# Patient Record
Sex: Male | Born: 1960 | Race: White | Hispanic: No | Marital: Married | Smoking: Never smoker
Health system: Southern US, Community
[De-identification: ages and names within clinical notes are randomized; demographics above are authoritative.]

## PROBLEM LIST (undated history)

## (undated) ENCOUNTER — Emergency Department (HOSPITAL_COMMUNITY): Discharge status: Absent without leave | Payer: BC Managed Care – PPO

## (undated) DIAGNOSIS — G500 Trigeminal neuralgia: Secondary | ICD-10-CM

## (undated) DIAGNOSIS — C19 Malignant neoplasm of rectosigmoid junction: Secondary | ICD-10-CM

## (undated) DIAGNOSIS — N189 Chronic kidney disease, unspecified: Secondary | ICD-10-CM

## (undated) DIAGNOSIS — Z86010 Personal history of colonic polyps: Secondary | ICD-10-CM

## (undated) HISTORY — DX: Personal history of colonic polyps: Z86.010

## (undated) HISTORY — DX: Chronic kidney disease, unspecified: N18.9

## (undated) HISTORY — PX: TRIGEMINAL NERVE DECOMPRESSION: SHX2579

## (undated) HISTORY — DX: Trigeminal neuralgia: G50.0

## (undated) HISTORY — DX: Malignant neoplasm of rectosigmoid junction: C19

## (undated) HISTORY — PX: WISDOM TOOTH EXTRACTION: SHX21

---

## 2004-06-13 ENCOUNTER — Encounter: Admission: RE | Admit: 2004-06-13 | Discharge: 2004-06-13 | Payer: Self-pay | Admitting: Internal Medicine

## 2004-06-13 ENCOUNTER — Encounter: Admission: RE | Admit: 2004-06-13 | Discharge: 2004-06-13 | Payer: Self-pay | Admitting: Neurosurgery

## 2010-12-05 ENCOUNTER — Ambulatory Visit
Admission: RE | Admit: 2010-12-05 | Discharge: 2010-12-05 | Payer: Self-pay | Source: Home / Self Care | Attending: Internal Medicine | Admitting: Internal Medicine

## 2011-01-05 ENCOUNTER — Ambulatory Visit (INDEPENDENT_AMBULATORY_CARE_PROVIDER_SITE_OTHER): Payer: BC Managed Care – PPO | Admitting: Internal Medicine

## 2011-01-05 DIAGNOSIS — H53469 Homonymous bilateral field defects, unspecified side: Secondary | ICD-10-CM

## 2012-09-06 ENCOUNTER — Ambulatory Visit (INDEPENDENT_AMBULATORY_CARE_PROVIDER_SITE_OTHER): Payer: BC Managed Care – PPO | Admitting: Internal Medicine

## 2012-09-06 ENCOUNTER — Encounter: Payer: Self-pay | Admitting: Internal Medicine

## 2012-09-06 ENCOUNTER — Other Ambulatory Visit: Payer: BC Managed Care – PPO | Admitting: Internal Medicine

## 2012-09-06 VITALS — BP 118/74 | HR 76 | Temp 97.7°F | Ht 73.0 in | Wt 194.0 lb

## 2012-09-06 DIAGNOSIS — E785 Hyperlipidemia, unspecified: Secondary | ICD-10-CM

## 2012-09-06 DIAGNOSIS — F4320 Adjustment disorder, unspecified: Secondary | ICD-10-CM

## 2012-09-06 DIAGNOSIS — F430 Acute stress reaction: Secondary | ICD-10-CM

## 2012-09-06 DIAGNOSIS — Z13220 Encounter for screening for lipoid disorders: Secondary | ICD-10-CM

## 2012-09-06 DIAGNOSIS — Z0000 Encounter for general adult medical examination without abnormal findings: Secondary | ICD-10-CM

## 2012-09-06 DIAGNOSIS — Z125 Encounter for screening for malignant neoplasm of prostate: Secondary | ICD-10-CM

## 2012-09-06 DIAGNOSIS — F4321 Adjustment disorder with depressed mood: Secondary | ICD-10-CM

## 2012-09-06 LAB — COMPREHENSIVE METABOLIC PANEL
ALT: 12 U/L (ref 0–53)
AST: 15 U/L (ref 0–37)
Calcium: 9.5 mg/dL (ref 8.4–10.5)
Chloride: 103 mEq/L (ref 96–112)
Creat: 1.24 mg/dL (ref 0.50–1.35)
Glucose, Bld: 79 mg/dL (ref 70–99)
Potassium: 4.2 mEq/L (ref 3.5–5.3)
Sodium: 140 mEq/L (ref 135–145)
Total Bilirubin: 0.7 mg/dL (ref 0.3–1.2)

## 2012-09-06 LAB — CBC WITH DIFFERENTIAL/PLATELET
Basophils Relative: 0 % (ref 0–1)
Hemoglobin: 16.7 g/dL (ref 13.0–17.0)
MCH: 31.3 pg (ref 26.0–34.0)
MCHC: 35.3 g/dL (ref 30.0–36.0)
MCV: 88.7 fL (ref 78.0–100.0)
Monocytes Absolute: 0.5 10*3/uL (ref 0.1–1.0)
Monocytes Relative: 8 % (ref 3–12)
Neutrophils Relative %: 64 % (ref 43–77)
Platelets: 232 10*3/uL (ref 150–400)
RBC: 5.33 MIL/uL (ref 4.22–5.81)
WBC: 6.9 10*3/uL (ref 4.0–10.5)

## 2012-09-06 LAB — LIPID PANEL
HDL: 49 mg/dL (ref >39)
Total CHOL/HDL Ratio: 5 Ratio
Triglycerides: 121 mg/dL (ref <150)

## 2012-09-08 DIAGNOSIS — E785 Hyperlipidemia, unspecified: Secondary | ICD-10-CM | POA: Insufficient documentation

## 2012-09-08 NOTE — Progress Notes (Signed)
  Subjective:    Patient ID: John Hunt, male    DOB: [birth year], 51 y.o.   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 Apparently last week his blood pressure was elevated in the 170 range but he was upset. Brother was dying of laryngeal cancer. Brother has since passed away and funeral is this coming weekend. Patient has appropriate grief reaction. Somewhat tearful in the office. Explained some issues going on with his family. Has elderly mother who lives alone. She has fallen once recently and had to go to the emergency department. His sister wants to move her to assisted living facility. Patient would like to keep his mother and time as long as possible as long as she is safe. She has a call device she wears around her neck. We talked at length about handling elderly parents. We talked about his brother's cancer. Blood pressure is fine in the office today. EKG was performed showing no LVH. He has no chest pain. No shortness of breath. He continues to practice martial arts and is able to do that without difficulty. Says he needs to have a colonoscopy in the near future.  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 He had been unresponsive to medical therapy.  Interestingly he had normal lipid panel in 2012.  History of prostatitis 1993  History of injured elbow 1991. No known drug allergies  Father died of an MI at 38  Mother with history of kidney and heart problems  Brother with kidney stones in GI disorder  Sister with kidney stones and GI disorder  One brother deceased. One brother living also a International aid/development worker with neurological disorder. Total of 7 siblings.  Patient does not smoke or consume alcohol. He is married and has 2 children, a son and a daughter. Daughter is a Conservation officer, historic buildings at [employer]. Patient has DVM degree from [employer]. 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 Wife is a International aid/development worker.    Review of Systems     Objective:   Physical Exam skin is warm and dry; neck is supple without thyromegaly JVD or carotid bruits; chest clear to auscultation; cardiac exam regular rate and rhythm normal S1 and S2; no murmurs appreciated. Abdomen no hepatosplenomegaly masses or tenderness. Extremities without edema. Neuro no focal deficits on brief neurological exam. 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 Patient will need this for upcoming funeral. Patient will continue to monitor his blood pressure at home. He will call me when he is ready to undergo colonoscopy and we will get that set up for him. Fasting labs show hyperlipidemia. Recommend we repeat fasting lipid panel in 3-6 months and consider statin therapy if still elevated.

## 2012-09-08 NOTE — Patient Instructions (Addendum)
Patient to have screening colonoscopy in the near future. Watch diet, continue exercise and repeat fasting lipid panel in 3-6 months.

## 2012-09-09 NOTE — Addendum Note (Signed)
Addended by: Judy Pimple on: 09/09/2012 11:48 AM   Modules accepted: Orders

## 2012-09-13 ENCOUNTER — Telehealth: Payer: Self-pay

## 2012-09-13 DIAGNOSIS — Z1211 Encounter for screening for malignant neoplasm of colon: Secondary | ICD-10-CM

## 2012-09-13 NOTE — Telephone Encounter (Signed)
Order entered in Epic for screening colonoscopy with Dr. Leone Payor. Phone number given so he may  Schedule his own appointment

## 2012-11-20 HISTORY — PX: COLONOSCOPY: SHX174

## 2013-02-11 ENCOUNTER — Encounter: Payer: Self-pay | Admitting: Internal Medicine

## 2013-02-25 ENCOUNTER — Encounter: Payer: Self-pay | Admitting: Internal Medicine

## 2013-02-25 ENCOUNTER — Ambulatory Visit (INDEPENDENT_AMBULATORY_CARE_PROVIDER_SITE_OTHER): Payer: BC Managed Care – PPO | Admitting: Internal Medicine

## 2013-02-25 VITALS — BP 120/80 | HR 68 | Temp 97.6°F | Wt 194.5 lb

## 2013-02-25 DIAGNOSIS — M25519 Pain in unspecified shoulder: Secondary | ICD-10-CM

## 2013-02-25 DIAGNOSIS — M25559 Pain in unspecified hip: Secondary | ICD-10-CM

## 2013-02-25 DIAGNOSIS — M25511 Pain in right shoulder: Secondary | ICD-10-CM

## 2013-02-25 DIAGNOSIS — K625 Hemorrhage of anus and rectum: Secondary | ICD-10-CM

## 2013-02-25 NOTE — Patient Instructions (Addendum)
Patient scheduled for an appointment  With Dr. Teressa Senter on 03/03/2013 at 1:15pm. Informed of this.

## 2013-03-11 ENCOUNTER — Other Ambulatory Visit: Payer: BC Managed Care – PPO | Admitting: Internal Medicine

## 2013-03-11 ENCOUNTER — Other Ambulatory Visit: Payer: Self-pay | Admitting: Internal Medicine

## 2013-03-11 DIAGNOSIS — Z79899 Other long term (current) drug therapy: Secondary | ICD-10-CM

## 2013-03-11 DIAGNOSIS — E785 Hyperlipidemia, unspecified: Secondary | ICD-10-CM

## 2013-03-11 DIAGNOSIS — M129 Arthropathy, unspecified: Secondary | ICD-10-CM

## 2013-03-11 LAB — SEDIMENTATION RATE: Sed Rate: 1 mm/hr (ref 0–16)

## 2013-03-11 LAB — CBC WITH DIFFERENTIAL/PLATELET
Eosinophils Absolute: 0.1 10*3/uL (ref 0.0–0.7)
Eosinophils Relative: 1 % (ref 0–5)
Hemoglobin: 16.3 g/dL (ref 13.0–17.0)
MCH: 31.2 pg (ref 26.0–34.0)
MCV: 88.7 fL (ref 78.0–100.0)
Neutrophils Relative %: 60 % (ref 43–77)
RDW: 13.3 % (ref 11.5–15.5)
WBC: 5.6 10*3/uL (ref 4.0–10.5)

## 2013-03-11 LAB — LIPID PANEL
Cholesterol: 225 mg/dL — ABNORMAL HIGH (ref 0–200)
HDL: 51 mg/dL (ref >39)
LDL Cholesterol: 154 mg/dL — ABNORMAL HIGH (ref 0–99)
Triglycerides: 101 mg/dL (ref <150)
VLDL: 20 mg/dL (ref 0–40)

## 2013-03-11 LAB — COMPREHENSIVE METABOLIC PANEL
Albumin: 4.1 g/dL (ref 3.5–5.2)
CO2: 28 mEq/L (ref 19–32)
Chloride: 104 mEq/L (ref 96–112)
Creat: 1.09 mg/dL (ref 0.50–1.35)
Potassium: 4.1 mEq/L (ref 3.5–5.3)
Sodium: 140 mEq/L (ref 135–145)
Total Protein: 6.6 g/dL (ref 6.0–8.3)

## 2013-03-12 LAB — CYCLIC CITRUL PEPTIDE ANTIBODY, IGG: Cyclic Citrullin Peptide Ab: <2 U/mL (ref 0.0–5.0)

## 2013-03-20 ENCOUNTER — Ambulatory Visit (AMBULATORY_SURGERY_CENTER): Payer: BC Managed Care – PPO | Admitting: *Deleted

## 2013-03-20 VITALS — Ht 74.0 in | Wt 198.6 lb

## 2013-03-20 DIAGNOSIS — Z1211 Encounter for screening for malignant neoplasm of colon: Secondary | ICD-10-CM

## 2013-03-20 MED ORDER — NA SULFATE-K SULFATE-MG SULF 17.5-3.13-1.6 GM/177ML PO SOLN: 1.0000 | Freq: Once | ORAL | Status: DC

## 2013-03-20 NOTE — Progress Notes (Signed)
No egg or soy allergy  Pt entered into the [facility] system and access code given

## 2013-03-21 ENCOUNTER — Encounter: Payer: Self-pay | Admitting: Internal Medicine

## 2013-04-03 ENCOUNTER — Ambulatory Visit (AMBULATORY_SURGERY_CENTER): Payer: BC Managed Care – PPO | Admitting: Internal Medicine

## 2013-04-03 ENCOUNTER — Encounter: Payer: Self-pay | Admitting: Internal Medicine

## 2013-04-03 VITALS — BP 122/73 | HR 63 | Temp 97.1°F | Resp 18 | Ht 74.0 in | Wt 198.0 lb

## 2013-04-03 DIAGNOSIS — D126 Benign neoplasm of colon, unspecified: Secondary | ICD-10-CM

## 2013-04-03 DIAGNOSIS — C19 Malignant neoplasm of rectosigmoid junction: Secondary | ICD-10-CM

## 2013-04-03 DIAGNOSIS — Z1211 Encounter for screening for malignant neoplasm of colon: Secondary | ICD-10-CM

## 2013-04-03 DIAGNOSIS — K5730 Diverticulosis of large intestine without perforation or abscess without bleeding: Secondary | ICD-10-CM

## 2013-04-03 DIAGNOSIS — C187 Malignant neoplasm of sigmoid colon: Secondary | ICD-10-CM

## 2013-04-03 DIAGNOSIS — K648 Other hemorrhoids: Secondary | ICD-10-CM

## 2013-04-03 HISTORY — DX: Malignant neoplasm of rectosigmoid junction: C19

## 2013-04-03 MED ORDER — SODIUM CHLORIDE 0.9 % IV SOLN: 500.0000 mL | INTRAVENOUS | Status: DC

## 2013-04-03 NOTE — Progress Notes (Signed)
Patient did not experience any of the following events: a burn prior to discharge; a fall within the facility; wrong site/side/patient/procedure/implant event; or a hospital transfer or hospital admission upon discharge from the facility. (G8907) Patient did not have preoperative order for IV antibiotic SSI prophylaxis. (G8918)  

## 2013-04-03 NOTE — Patient Instructions (Addendum)
I found and removed three polyps. Two were recovered (the larger rectosigmoid polyps) and one 5 mm polyp was not retrieved (sigmoid).  You also have diverticulosis and hemorrhoids. Usually not a problem.  I will let you know pathology results and when to have another routine colonoscopy by mail.  I appreciate the opportunity to care for you.  Iva Boop, MD, FACG  YOU HAD AN ENDOSCOPIC PROCEDURE TODAY AT THE [facility]: Refer to the procedure report that was given to you for any specific questions about what was found during the examination.  If the procedure report does not answer your questions, please call your gastroenterologist to clarify.  If you requested that your care partner not be given the details of your procedure findings, then the procedure report has been included in a sealed envelope for you to review at your convenience later.  YOU SHOULD EXPECT: Some feelings of bloating in the abdomen. Passage of more gas than usual.  Walking can help get rid of the air that was put into your GI tract during the procedure and reduce the bloating. If you had a lower endoscopy (such as a colonoscopy or flexible sigmoidoscopy) you may notice spotting of blood in your stool or on the toilet paper. If you underwent a bowel prep for your procedure, then you may not have a normal bowel movement for a few days.  DIET: Your first meal following the procedure should be a light meal and then it is ok to progress to your normal diet.  A half-sandwich or bowl of soup is an example of a good first meal.  Heavy or fried foods are harder to digest and may make you feel nauseous or bloated.  Likewise meals heavy in dairy and vegetables can cause extra gas to form and this can also increase the bloating.  Drink plenty of fluids but you should avoid alcoholic beverages for 24 hours.  ACTIVITY: Your care partner should take you home directly after the procedure.  You should plan to take it easy,  moving slowly for the rest of the day.  You can resume normal activity the day after the procedure however you should NOT DRIVE or use heavy machinery for 24 hours (because of the sedation medicines used during the test).    SYMPTOMS TO REPORT IMMEDIATELY: A gastroenterologist can be reached at any hour.  During normal business hours, 8:30 AM to 5:00 PM Monday through Friday, call [phone].  After hours and on weekends, please call the GI answering service at [phone] who will take a message and have the physician on call contact you.   Following lower endoscopy (colonoscopy or flexible sigmoidoscopy):  Excessive amounts of blood in the stool  Significant tenderness or worsening of abdominal pains  Swelling of the abdomen that is new, acute  Fever of 100F or higher  FOLLOW UP: If any biopsies were taken you will be contacted by phone or by letter within the next 1-3 weeks.  Call your gastroenterologist if you have not heard about the biopsies in 3 weeks.  Our staff will call the home number listed on your records the next business day following your procedure to check on you and address any questions or concerns that you may have at that time regarding the information given to you following your procedure. This is a courtesy call and so if there is no answer at the home number and we have not heard from you through the emergency physician  on call, we will assume that you have returned to your regular daily activities without incident.  SIGNATURES/CONFIDENTIALITY: You and/or your care partner have signed paperwork which will be entered into your electronic medical record.  These signatures attest to the fact that that the information above on your After Visit Summary has been reviewed and is understood.  Full responsibility of the confidentiality of this discharge information lies with you and/or your care-partner.

## 2013-04-03 NOTE — Op Note (Signed)
[facility] [address] [place], [address]   COLONOSCOPY PROCEDURE REPORT PATIENT: John Hunt, John Hunt  MR#: [id number] BIRTHDATE: 12/09/1960 , 52  yrs. old GENDER: Male ENDOSCOPIST: Iva Boop, MD, [facility] REFERRED NF:AOZH Waymond Cera, M.D. PROCEDURE DATE:  04/03/2013 PROCEDURE:   Colonoscopy with snare polypectomy ASA CLASS:   Class I INDICATIONS:average risk screening. MEDICATIONS: propofol (Diprivan) 350mg  IV, MAC sedation, administered by CRNA, and These medications were titrated to patient response per physician's verbal order  DESCRIPTION OF PROCEDURE:   After the risks benefits and alternatives of the procedure were thoroughly explained, informed consent was obtained.  A digital rectal exam revealed no abnormalities of the rectum, A digital rectal exam revealed no prostatic nodules, and A digital rectal exam revealed the prostate was not enlarged.   The LB PCF-Q180AL [id number]  endoscope was introduced through the anus and advanced to the cecum, which was identified by both the appendix and ileocecal valve. No adverse events experienced.   The quality of the prep was excellent using Suprep  The instrument was then slowly withdrawn as the colon was fully examined.    COLON FINDINGS: Two polypoid shaped pedunculated polyps measuring 12 and 20 mm in size were found in the rectosigmoid colon.  A polypectomy was performed using snare cautery.  The resection was complete and the polyp tissue was completely retrieved.   A polypoid shaped sessile polyp measuring 5 mm in size was found in the sigmoid colon.  A polypectomy was performed with a cold snare. The resection was complete and the polyp tissue was not retrieved. Moderate diverticulosis was noted in the sigmoid colon.   The colon mucosa was otherwise normal.   A right colon retroflexion was performed.  Retroflexed views revealed internal hemorrhoids. The time to cecum=5 minutes 06 seconds.  Withdrawal  time=16 minutes 04 seconds.  The scope was withdrawn and the procedure completed. COMPLICATIONS: There were no complications.  ENDOSCOPIC IMPRESSION: 1.   Two pedunculated polyps measuring 12 and 20 mm in size were found in the rectosigmoid colon; polypectomy was performed using snare cautery 2.   Sessile polyp measuring 5 mm in size was found in the sigmoid colon; polypectomy was performed with a cold snare 3.   Moderate diverticulosis was noted in the sigmoid colon 4.   The colon mucosa was otherwise normal 5.   Internal hemorrhoids  RECOMMENDATIONS: 1.  Hold aspirin, aspirin products, and anti-inflammatory medication for 2 weeks. 2.  Timing of repeat colonoscopy will be determined by pathology findings.  eSigned:  Iva Boop, MD, [facility] 04/03/2013 10:00 AM   cc: The Patient and Sharlet Salina, MD

## 2013-04-03 NOTE — Progress Notes (Signed)
Called to room to assist during endoscopic procedure.  Patient ID and intended procedure confirmed with present staff. Received instructions for my participation in the procedure from the performing physician.  

## 2013-04-03 NOTE — Progress Notes (Signed)
Lidocaine-40mg IV prior to Propofol InductionPropofol given over incremental dosages 

## 2013-04-04 ENCOUNTER — Telehealth: Payer: Self-pay

## 2013-04-04 NOTE — Telephone Encounter (Signed)
  Follow up Call-  Call back number 04/03/2013  Post procedure Call Back phone  # [phone]  Permission to leave phone message Yes     Patient questions:  Do you have a fever, pain , or abdominal swelling? no Pain Score  0 *  Have you tolerated food without any problems? yes  Have you been able to return to your normal activities? yes  Do you have any questions about your discharge instructions: Diet   no Medications  no Follow up visit  no  Do you have questions or concerns about your Care? no  Actions: * If pain score is 4 or above: No action needed, pain <4.

## 2013-04-09 ENCOUNTER — Encounter: Payer: Self-pay | Admitting: Internal Medicine

## 2013-04-09 DIAGNOSIS — Z860100 Personal history of colon polyps, unspecified: Secondary | ICD-10-CM

## 2013-04-09 HISTORY — DX: Personal history of colon polyps, unspecified: Z86.0100

## 2013-04-09 HISTORY — DX: Personal history of colonic polyps: Z86.010

## 2013-04-09 NOTE — Progress Notes (Signed)
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

## 2013-07-09 ENCOUNTER — Other Ambulatory Visit: Payer: Self-pay

## 2013-08-12 NOTE — Progress Notes (Signed)
  Subjective:    Patient ID: John Hunt, male    DOB: Mar 25, 1961, 52 y.o.   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 Also tells me today that he's had some rectal bleeding. Has colonoscopy set up for. Wants to be checked for Bartonella. Says that  Veterinarians  are exposed to this quite a bit  And he has ability to have special testing for it through a a special lab. He says that we collect the blood he will have the test done. Agrees to come in the near future for fasting lab work including fasting lipid panel.    Review of Systems     Objective:   Physical Exam decreased range of motion right upper extremity. Cannot raise right upper extremity completely up over his head. Difficulty with abduction and dorsiflexion of right upper extremity. Rectal bleeding was not assessed today as he has an upcoming colonoscopy. Mild pain with internal and external rotation of the hip.        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 Right rotator cuff tear-refer to Dr. Teressa Senter  Rectal bleeding-needs colonoscopy. Appointment has been scheduled for May  Plan: Patient will have fasting lab work in the near future including arthritis studies and evaluation for Bartonella at his request.  Addendum 03/12/2013 has moderate hyperlipidemia with total cholesterol 225 and LDL cholesterol 154. Arthritis studies are negative. Patient should return in 6 months for physical examination. Trial of diet and exercise for hyperlipidemia.

## 2013-10-14 ENCOUNTER — Telehealth: Payer: Self-pay

## 2013-10-14 NOTE — Telephone Encounter (Signed)
Left message for patient to call back  

## 2013-10-14 NOTE — Telephone Encounter (Signed)
Message copied by Annett Fabian on Tue Oct 14, 2013 10:30 AM ------      Message from: Iva Boop      Created: Tue Oct 14, 2013  7:50 AM      Regarding: needs flex sig       Please call Dr. 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

## 2013-10-20 ENCOUNTER — Encounter: Payer: Self-pay | Admitting: Internal Medicine

## 2013-10-20 NOTE — Telephone Encounter (Signed)
I have left another message for the patient to call back and mailed a letter.

## 2013-11-27 ENCOUNTER — Ambulatory Visit (AMBULATORY_SURGERY_CENTER): Payer: Self-pay

## 2013-11-27 VITALS — Ht 73.0 in | Wt 190.0 lb

## 2013-11-27 DIAGNOSIS — Z85038 Personal history of other malignant neoplasm of large intestine: Secondary | ICD-10-CM

## 2013-11-28 ENCOUNTER — Encounter: Payer: Self-pay | Admitting: Internal Medicine

## 2013-12-11 ENCOUNTER — Ambulatory Visit (AMBULATORY_SURGERY_CENTER): Payer: BC Managed Care – PPO | Admitting: Internal Medicine

## 2013-12-11 ENCOUNTER — Encounter: Payer: Self-pay | Admitting: Internal Medicine

## 2013-12-11 VITALS — BP 128/79 | HR 64 | Temp 96.5°F | Resp 8 | Ht 73.0 in | Wt 190.0 lb

## 2013-12-11 DIAGNOSIS — D129 Benign neoplasm of anus and anal canal: Secondary | ICD-10-CM

## 2013-12-11 DIAGNOSIS — D128 Benign neoplasm of rectum: Secondary | ICD-10-CM

## 2013-12-11 DIAGNOSIS — D126 Benign neoplasm of colon, unspecified: Secondary | ICD-10-CM

## 2013-12-11 DIAGNOSIS — Z85038 Personal history of other malignant neoplasm of large intestine: Secondary | ICD-10-CM

## 2013-12-11 MED ORDER — SODIUM CHLORIDE 0.9 % IV SOLN: 500.0000 mL | INTRAVENOUS | Status: DC

## 2013-12-11 NOTE — Progress Notes (Signed)
No sedation. Report to pacu rn, vss, bbs=clear

## 2013-12-11 NOTE — Progress Notes (Signed)
Patient discussed procedure and sedation options with the patient. Decision to proceed with procedure without sedation. No IV ordered.

## 2013-12-11 NOTE — Op Note (Signed)
[place]  [address] [place], [address]   FLEXIBLE SIGMOIDOSCOPY PROCEDURE REPORT  PATIENT: John, Hunt  MR#: [id number] BIRTHDATE: Aug 14, 1961 , 52  yrs. old GENDER: Male ENDOSCOPIST: Gatha Mayer, MD, [facility] PROCEDURE DATE:  12/11/2013 PROCEDURE:   Sigmoidoscopy with biopsy ASA CLASS:   Class II INDICATIONS:follow up for previously diagnosed colorectal cancer. MEDICATIONS: None  DESCRIPTION OF PROCEDURE:   After the risks benefits and alternatives of the procedure were thoroughly explained, informed consent was obtained.  revealed no abnormalities of the rectum. The LB PFC-H190 [id number]  endoscope was introduced through the anus  and advanced to the sigmoid colon , limited by No adverse events experienced.   The quality of the prep was good .  The instrument was then slowly withdrawn as the mucosa was fully examined.       1.  COLON FINDINGS: Moderate diverticulosis was noted. 2.  A sessile polyp measuring 2 mm in size was found in the rectum. A polypectomy was performed with cold forceps.  The resection was complete and the polyp tissue was completely retrieved. Retroflexed views revealed no abnormalities.    The scope was then withdrawn from the patient and the procedure terminated.  COMPLICATIONS: There were no complications.  ENDOSCOPIC IMPRESSION: 1.   Moderate diverticulosis was noted 2.   Sessile polyp measuring 2 mm in size was found in the rectum; polypectomy was performed with cold forceps 3.   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

## 2013-12-11 NOTE — Patient Instructions (Addendum)
One tiny polyp removed today - also have diverticulosis  I will let you know pathology results and when to have another routine colonoscopy by mail.  I appreciate the opportunity to care for you. Gatha Mayer, MD, [facility]  Discharge instructions given with verbal understanding. Handouts on polyps and diverticulosis. Resume previous medications. YOU HAD AN ENDOSCOPIC PROCEDURE TODAY AT [facility]: Refer to the procedure report that was given to you for any specific questions about what was found during the examination.  If the procedure report does not answer your questions, please call your gastroenterologist to clarify.  If you requested that your care partner not be given the details of your procedure findings, then the procedure report has been included in a sealed envelope for you to review at your convenience later.  YOU SHOULD EXPECT: Some feelings of bloating in the abdomen. Passage of more gas than usual.  Walking can help get rid of the air that was put into your GI tract during the procedure and reduce the bloating. If you had a lower endoscopy (such as a colonoscopy or flexible sigmoidoscopy) you may notice spotting of blood in your stool or on the toilet paper. If you underwent a bowel prep for your procedure, then you may not have a normal bowel movement for a few days.  DIET: Your first meal following the procedure should be a light meal and then it is ok to progress to your normal diet.  A half-sandwich or bowl of soup is an example of a good first meal.  Heavy or fried foods are harder to digest and may make you feel nauseous or bloated.  Likewise meals heavy in dairy and vegetables can cause extra gas to form and this can also increase the bloating.  Drink plenty of fluids but you should avoid alcoholic beverages for 24 hours.  ACTIVITY: Your care partner should take you home directly after the procedure.  You should plan to take it easy, moving slowly for the rest  of the day.  You can resume normal activity the day after the procedure however you should NOT DRIVE or use heavy machinery for 24 hours (because of the sedation medicines used during the test).    SYMPTOMS TO REPORT IMMEDIATELY: A gastroenterologist can be reached at any hour.  During normal business hours, 8:30 AM to 5:00 PM Monday through Friday, call [phone].  After hours and on weekends, please call the GI answering service at [phone] who will take a message and have the physician on call contact you.   Following lower endoscopy (colonoscopy or flexible sigmoidoscopy):  Excessive amounts of blood in the stool  Significant tenderness or worsening of abdominal pains  Swelling of the abdomen that is new, acute  Fever of 100F or higher  FOLLOW UP: If any biopsies were taken you will be contacted by phone or by letter within the next 1-3 weeks.  Call your gastroenterologist if you have not heard about the biopsies in 3 weeks.  Our staff will call the home number listed on your records the next business day following your procedure to check on you and address any questions or concerns that you may have at that time regarding the information given to you following your procedure. This is a courtesy call and so if there is no answer at the home number and we have not heard from you through the emergency physician on call, we will assume that you have returned to your  regular daily activities without incident.  SIGNATURES/CONFIDENTIALITY: You and/or your care partner have signed paperwork which will be entered into your electronic medical record.  These signatures attest to the fact that that the information above on your After Visit Summary has been reviewed and is understood.  Full responsibility of the confidentiality of this discharge information lies with you and/or your care-partner.

## 2013-12-11 NOTE — Progress Notes (Signed)
Called to room to assist during endoscopic procedure.  Patient ID and intended procedure confirmed with present staff. Received instructions for my participation in the procedure from the performing physician.  

## 2013-12-11 NOTE — Progress Notes (Signed)
No sedation and no IV given. Discharge home.

## 2013-12-12 ENCOUNTER — Telehealth: Payer: Self-pay

## 2013-12-12 NOTE — Telephone Encounter (Signed)
  Follow up Call-  Call back number 12/11/2013 04/03/2013  Post procedure Call Back phone  # [phone]  Permission to leave phone message Yes Yes     Patient questions:  Do you have a fever, pain , or abdominal swelling? no Pain Score  0 *  Have you tolerated food without any problems? yes  Have you been able to return to your normal activities? yes  Do you have any questions about your discharge instructions: Diet   no Medications  no Follow up visit  no  Do you have questions or concerns about your Care? no  Actions: * If pain score is 4 or above: No action needed, pain <4.

## 2013-12-17 ENCOUNTER — Encounter: Payer: Self-pay | Admitting: Internal Medicine

## 2013-12-17 NOTE — Progress Notes (Signed)
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

## 2014-07-01 ENCOUNTER — Encounter: Payer: Self-pay | Admitting: Internal Medicine

## 2014-07-01 ENCOUNTER — Ambulatory Visit (INDEPENDENT_AMBULATORY_CARE_PROVIDER_SITE_OTHER): Payer: BC Managed Care – PPO | Admitting: Internal Medicine

## 2014-07-01 VITALS — BP 126/82 | Temp 98.3°F | Wt 198.0 lb

## 2014-07-01 DIAGNOSIS — H6092 Unspecified otitis externa, left ear: Secondary | ICD-10-CM

## 2014-07-01 DIAGNOSIS — H6122 Impacted cerumen, left ear: Secondary | ICD-10-CM

## 2014-07-01 DIAGNOSIS — H6120 Impacted cerumen, unspecified ear: Secondary | ICD-10-CM

## 2014-07-01 DIAGNOSIS — H60399 Other infective otitis externa, unspecified ear: Secondary | ICD-10-CM

## 2014-07-01 MED ORDER — AMOXICILLIN 500 MG PO CAPS: 500.0000 mg | ORAL_CAPSULE | Freq: Three times a day (TID) | ORAL | Status: DC

## 2014-07-01 MED ORDER — NEOMYCIN-POLYMYXIN-HC 3.5-10000-1 OT SOLN: 4.0000 [drp] | Freq: Four times a day (QID) | OTIC | Status: DC

## 2014-07-01 NOTE — Patient Instructions (Signed)
Patient to use Cortisporin Otic suspension in left external ear canal 4 times daily. Take amoxicillin 500 mg 3 times a day for 10 days. Appointment with Dr. Ernesto Rutherford to remove cerumen.

## 2014-07-01 NOTE — Progress Notes (Signed)
   Subjective:    Patient ID: CELEDONIO SORTINO, male    DOB: 1961/04/20, 53 y.o.   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 He has a history of impacted cerumen left external ear canal. He was at the beach a couple of weeks ago. Recently noticed left ear was stopped up.  Wife took a curette and tried to clean ear out but ear became irritated. Ear is still stopped up and sore to touch.    Review of Systems     Objective:   Physical Exam  Left external ear canal is erythematous. There is an area of impacted cerumen posteriorly that cannot be removed easily with curette due to tenderness      Assessment & Plan:  Impacted cerumen left external ear canal  Left external otitis  Plan: Cortisporin Otic suspension to use in left external ear canal 4 times a day for 5-7 days. Amoxicillin 500 mg 3 times a day for 10 days. Appointment with ENT physician for ear wax removal.

## 2014-07-03 ENCOUNTER — Telehealth: Payer: Self-pay

## 2014-07-03 DIAGNOSIS — H6122 Impacted cerumen, left ear: Secondary | ICD-10-CM

## 2014-07-03 NOTE — Telephone Encounter (Signed)
Notes and demographics faxed to Dr. Berle Mull office, and they will call the patient to schedule an appointment.

## 2014-08-11 ENCOUNTER — Emergency Department (HOSPITAL_COMMUNITY): Payer: BC Managed Care – PPO

## 2014-08-11 ENCOUNTER — Encounter (HOSPITAL_COMMUNITY): Payer: Self-pay | Admitting: Emergency Medicine

## 2014-08-11 ENCOUNTER — Emergency Department (HOSPITAL_COMMUNITY)
Admission: EM | Admit: 2014-08-11 | Discharge: 2014-08-11 | Disposition: A | Discharge status: Home / Self Care | Payer: BC Managed Care – PPO | Attending: Emergency Medicine | Admitting: Emergency Medicine

## 2014-08-11 DIAGNOSIS — Z85048 Personal history of other malignant neoplasm of rectum, rectosigmoid junction, and anus: Secondary | ICD-10-CM | POA: Insufficient documentation

## 2014-08-11 DIAGNOSIS — Z8669 Personal history of other diseases of the nervous system and sense organs: Secondary | ICD-10-CM | POA: Diagnosis not present

## 2014-08-11 DIAGNOSIS — N200 Calculus of kidney: Secondary | ICD-10-CM | POA: Diagnosis not present

## 2014-08-11 DIAGNOSIS — R112 Nausea with vomiting, unspecified: Secondary | ICD-10-CM | POA: Diagnosis not present

## 2014-08-11 DIAGNOSIS — Z860100 Personal history of colon polyps, unspecified: Secondary | ICD-10-CM | POA: Insufficient documentation

## 2014-08-11 DIAGNOSIS — R1031 Right lower quadrant pain: Secondary | ICD-10-CM

## 2014-08-11 DIAGNOSIS — N189 Chronic kidney disease, unspecified: Secondary | ICD-10-CM | POA: Diagnosis not present

## 2014-08-11 LAB — I-STAT CHEM 8, ED
BUN: 20 mg/dL (ref 6–23)
CALCIUM ION: 1.07 mmol/L — AB (ref 1.12–1.23)
Chloride: 104 mEq/L (ref 96–112)
Creatinine, Ser: 1.2 mg/dL (ref 0.50–1.35)
Glucose, Bld: 185 mg/dL — ABNORMAL HIGH (ref 70–99)
HEMATOCRIT: 45 % (ref 39.0–52.0)
Hemoglobin: 15.3 g/dL (ref 13.0–17.0)
Potassium: 4.4 mEq/L (ref 3.7–5.3)
SODIUM: 138 meq/L (ref 137–147)
TCO2: 29 mmol/L (ref 0–100)

## 2014-08-11 LAB — CBC WITH DIFFERENTIAL/PLATELET
BASOS ABS: 0 10*3/uL (ref 0.0–0.1)
Basophils Relative: 0 % (ref 0–1)
Eosinophils Absolute: 0 10*3/uL (ref 0.0–0.7)
Eosinophils Relative: 0 % (ref 0–5)
HCT: 43.5 % (ref 39.0–52.0)
Hemoglobin: 14.8 g/dL (ref 13.0–17.0)
LYMPHS ABS: 1.3 10*3/uL (ref 0.7–4.0)
LYMPHS PCT: 20 % (ref 12–46)
MCH: 30.7 pg (ref 26.0–34.0)
MCHC: 34 g/dL (ref 30.0–36.0)
MCV: 90.2 fL (ref 78.0–100.0)
Monocytes Absolute: 0.5 10*3/uL (ref 0.1–1.0)
Monocytes Relative: 7 % (ref 3–12)
NEUTROS PCT: 73 % (ref 43–77)
Neutro Abs: 4.9 10*3/uL (ref 1.7–7.7)
PLATELETS: 216 10*3/uL (ref 150–400)
RBC: 4.82 MIL/uL (ref 4.22–5.81)
RDW: 12.6 % (ref 11.5–15.5)
WBC: 6.8 10*3/uL (ref 4.0–10.5)

## 2014-08-11 LAB — URINALYSIS, ROUTINE W REFLEX MICROSCOPIC
Bilirubin Urine: NEGATIVE
GLUCOSE, UA: 250 mg/dL — AB
KETONES UR: NEGATIVE mg/dL
LEUKOCYTES UA: NEGATIVE
NITRITE: NEGATIVE
Protein, ur: NEGATIVE mg/dL
Specific Gravity, Urine: 1.023 (ref 1.005–1.030)
Urobilinogen, UA: 0.2 mg/dL (ref 0.0–1.0)
pH: 5 (ref 5.0–8.0)

## 2014-08-11 LAB — COMPREHENSIVE METABOLIC PANEL
ALT: 14 U/L (ref 0–53)
AST: 25 U/L (ref 0–37)
Albumin: 3.6 g/dL (ref 3.5–5.2)
Alkaline Phosphatase: 57 U/L (ref 39–117)
Anion gap: 12 (ref 5–15)
BUN: 15 mg/dL (ref 6–23)
CALCIUM: 8.8 mg/dL (ref 8.4–10.5)
CO2: 24 mEq/L (ref 19–32)
Chloride: 100 mEq/L (ref 96–112)
Creatinine, Ser: 1.05 mg/dL (ref 0.50–1.35)
GFR calc Af Amer: >90 mL/min (ref >90)
GFR, EST NON AFRICAN AMERICAN: 80 mL/min — AB (ref >90)
GLUCOSE: 185 mg/dL — AB (ref 70–99)
Potassium: 4.3 mEq/L (ref 3.7–5.3)
Sodium: 136 mEq/L — ABNORMAL LOW (ref 137–147)
Total Bilirubin: 0.3 mg/dL (ref 0.3–1.2)
Total Protein: 6.7 g/dL (ref 6.0–8.3)

## 2014-08-11 LAB — URINE MICROSCOPIC-ADD ON

## 2014-08-11 MED ORDER — MORPHINE SULFATE 4 MG/ML IJ SOLN
4.0000 mg | Freq: Once | INTRAMUSCULAR | Status: AC
  Administered 2014-08-11: 4 mg via INTRAVENOUS
  Filled 2014-08-11: qty 1

## 2014-08-11 MED ORDER — IOHEXOL 300 MG/ML  SOLN
100.0000 mL | Freq: Once | INTRAMUSCULAR | Status: AC | PRN
  Administered 2014-08-11: 100 mL via INTRAVENOUS

## 2014-08-11 MED ORDER — ONDANSETRON HCL 4 MG PO TABS: 4.0000 mg | ORAL_TABLET | Freq: Four times a day (QID) | ORAL | Status: DC

## 2014-08-11 MED ORDER — OXYCODONE-ACETAMINOPHEN 5-325 MG PO TABS: 2.0000 | ORAL_TABLET | Freq: Four times a day (QID) | ORAL | Status: DC | PRN

## 2014-08-11 MED ORDER — SODIUM CHLORIDE 0.9 % IV BOLUS (SEPSIS)
1000.0000 mL | Freq: Once | INTRAVENOUS | Status: AC
  Administered 2014-08-11: 1000 mL via INTRAVENOUS

## 2014-08-11 MED ORDER — ONDANSETRON HCL 4 MG/2ML IJ SOLN
4.0000 mg | Freq: Once | INTRAMUSCULAR | Status: AC
  Administered 2014-08-11: 4 mg via INTRAVENOUS
  Filled 2014-08-11: qty 2

## 2014-08-11 NOTE — ED Notes (Signed)
Bed: WA11 Expected date:  Expected time:  Means of arrival:  Comments: Hold for triage 4 

## 2014-08-11 NOTE — ED Notes (Signed)
Pt finished drinking contrast. CT informed.

## 2014-08-11 NOTE — ED Notes (Signed)
Pt able to drink CT contrast without n/v.

## 2014-08-11 NOTE — ED Provider Notes (Signed)
CSN: [id number]     Arrival date & time 08/11/14  1533 History   First MD Initiated Contact with Patient 08/11/14 1559     Chief Complaint  Patient presents with  . RLQ pain      (Consider location/radiation/quality/duration/timing/severity/associated sxs/prior Treatment) HPI Comments: Patient with history of kidney stones and colon resection presents to the ED with a chief complaint of RLQ abdominal pain.  He states that the pain started on Sunday.  It has been intermittent, but progressively worsening.  Currently he rates his pain as a 4/10, but states that it gets as high as an 8/10.  He reports associated nausea, vomiting, and urinary frequency.  He denies fevers, chills, chest pain, SOB, diarrhea, constipation, hematochezia, melena, or dysuria.  He has tried taking ibuprofen with some relief.  The history is provided by the patient. No language interpreter was used.    Past Medical History  Diagnosis Date  . Chronic kidney disease     renal stones  . Trigeminal neuralgia   . 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 Personal history of colonic adenomas 04/09/2013   Past Surgical History  Procedure Laterality Date  . Trigeminal nerve decompression    . Colonoscopy     Family History  Problem Relation Age of Onset  . Colon cancer Neg Hx   . Heart disease Father    History  Substance Use Topics  . Smoking status: Never Smoker   . Smokeless tobacco: Never Used  . Alcohol Use: No    Review of Systems  Constitutional: Negative for fever and chills.  Respiratory: Negative for shortness of breath.   Cardiovascular: Negative for chest pain.  Gastrointestinal: Positive for nausea, vomiting and abdominal pain. Negative for diarrhea and constipation.  Genitourinary: Negative for dysuria.  All other systems reviewed and are negative.     Allergies  Review of patient's allergies indicates no known allergies.  Home Medications   Prior to Admission medications    Medication Sig Start Date End Date Taking? Authorizing Provider  ibuprofen (ADVIL,MOTRIN) 800 MG tablet Take 800 mg by mouth every 8 (eight) hours as needed.    Historical Provider, MD   BP 118/69  Pulse 63  Temp(Src) 97.9 F (36.6 C) (Oral)  Resp 16  SpO2 99% Physical Exam  Nursing note and vitals reviewed. Constitutional: He is oriented to person, place, and time. He appears well-developed and well-nourished.  HENT:  Head: Normocephalic and atraumatic.  Eyes: Conjunctivae and EOM are normal. Pupils are equal, round, and reactive to light. Right eye exhibits no discharge. Left eye exhibits no discharge. No scleral icterus.  Neck: Normal range of motion. Neck supple. No JVD present.  Cardiovascular: Normal rate, regular rhythm and normal heart sounds.  Exam reveals no gallop and no friction rub.   No murmur heard. Pulmonary/Chest: Effort normal and breath sounds normal. No respiratory distress. He has no wheezes. He has no rales. He exhibits no tenderness.  Abdominal: Soft. He exhibits no distension and no mass. There is no tenderness. There is no rebound and no guarding.  Moderate RLQ and right flank tenderness, no other focal abdominal tenderness, no fluid wave, no signs of peritonitis  Musculoskeletal: Normal range of motion. He exhibits no edema and no tenderness.  Neurological: He is alert and oriented to person, place, and time.  Skin: Skin is warm and dry.  Psychiatric: He has a normal mood and affect. His behavior is normal. Judgment and thought content normal.    ED  Course  Procedures (including critical care time) Results for orders placed during the hospital encounter of 08/11/14  COMPREHENSIVE METABOLIC PANEL      Result Value Ref Range   Sodium 136 (*) 137 - 147 mEq/L   Potassium 4.3  3.7 - 5.3 mEq/L   Chloride 100  96 - 112 mEq/L   CO2 24  19 - 32 mEq/L   Glucose, Bld 185 (*) 70 - 99 mg/dL   BUN 15  6 - 23 mg/dL   Creatinine, Ser 1.05  0.50 - 1.35 mg/dL    Calcium 8.8  8.4 - 10.5 mg/dL   Total Protein 6.7  6.0 - 8.3 g/dL   Albumin 3.6  3.5 - 5.2 g/dL   AST 25  0 - 37 U/L   ALT 14  0 - 53 U/L   Alkaline Phosphatase 57  39 - 117 U/L   Total Bilirubin 0.3  0.3 - 1.2 mg/dL   GFR calc non Af Amer 80 (*) >90 mL/min   GFR calc Af Amer >90  >90 mL/min   Anion gap 12  5 - 15  CBC WITH DIFFERENTIAL      Result Value Ref Range   WBC 6.8  4.0 - 10.5 K/uL   RBC 4.82  4.22 - 5.81 MIL/uL   Hemoglobin 14.8  13.0 - 17.0 g/dL   HCT 43.5  39.0 - 52.0 %   MCV 90.2  78.0 - 100.0 fL   MCH 30.7  26.0 - 34.0 pg   MCHC 34.0  30.0 - 36.0 g/dL   RDW 12.6  11.5 - 15.5 %   Platelets 216  150 - 400 K/uL   Neutrophils Relative % 73  43 - 77 %   Neutro Abs 4.9  1.7 - 7.7 K/uL   Lymphocytes Relative 20  12 - 46 %   Lymphs Abs 1.3  0.7 - 4.0 K/uL   Monocytes Relative 7  3 - 12 %   Monocytes Absolute 0.5  0.1 - 1.0 K/uL   Eosinophils Relative 0  0 - 5 %   Eosinophils Absolute 0.0  0.0 - 0.7 K/uL   Basophils Relative 0  0 - 1 %   Basophils Absolute 0.0  0.0 - 0.1 K/uL  URINALYSIS, ROUTINE W REFLEX MICROSCOPIC      Result Value Ref Range   Color, Urine YELLOW  YELLOW   APPearance CLEAR  CLEAR   Specific Gravity, Urine 1.023  1.005 - 1.030   pH 5.0  5.0 - 8.0   Glucose, UA 250 (*) NEGATIVE mg/dL   Hgb urine dipstick MODERATE (*) NEGATIVE   Bilirubin Urine NEGATIVE  NEGATIVE   Ketones, ur NEGATIVE  NEGATIVE mg/dL   Protein, ur NEGATIVE  NEGATIVE mg/dL   Urobilinogen, UA 0.2  0.0 - 1.0 mg/dL   Nitrite NEGATIVE  NEGATIVE   Leukocytes, UA NEGATIVE  NEGATIVE  URINE MICROSCOPIC-ADD ON      Result Value Ref Range   Squamous Epithelial / LPF RARE  RARE   WBC, UA 3-6  <3 WBC/hpf   RBC / HPF 7-10  <3 RBC/hpf   Bacteria, UA RARE  RARE  I-STAT CHEM 8, ED      Result Value Ref Range   Sodium 138  137 - 147 mEq/L   Potassium 4.4  3.7 - 5.3 mEq/L   Chloride 104  96 - 112 mEq/L   BUN 20  6 - 23 mg/dL   Creatinine, Ser 1.20  0.50 - 1.35  mg/dL   Glucose, Bld 185 (*)  70 - 99 mg/dL   Calcium, Ion 1.07 (*) 1.12 - 1.23 mmol/L   TCO2 29  0 - 100 mmol/L   Hemoglobin 15.3  13.0 - 17.0 g/dL   HCT 45.0  39.0 - 52.0 %   Ct Abdomen Pelvis W Contrast  08/11/2014   CLINICAL DATA:  Right lower quadrant pain for 3 days. Worsening pain with vomiting today. History of adenocarcinoma of rectosigmoid junction.  EXAM: CT ABDOMEN AND PELVIS WITH CONTRAST  TECHNIQUE: Multidetector CT imaging of the abdomen and pelvis was performed using the standard protocol following bolus administration of intravenous contrast.  CONTRAST:  155mL OMNIPAQUE IOHEXOL 300 MG/ML  SOLN  COMPARISON:  None.  FINDINGS: Lower chest: Perifissural 3 mm right lung base nodule on image 2. Volume loss of the left hemidiaphragm secondary to moderate left hemidiaphragm elevation. Normal heart size without pericardial or pleural effusion.  Hepatobiliary: Central left hepatic lobe cyst of 11 mm. A too small to characterize right liver lobe lesion is also likely a cyst. Normal gallbladder, without biliary ductal dilatation.  Spleen: Normal  Pancreas: Normal, without mass or pancreatic ductal dilatation.  Stomach/Bowel: Normal stomach, without wall thickening. Sigmoid diverticulosis. Equivocal soft tissue fullness at the rectosigmoid junction, including on image 85. Normal terminal ileum and appendix. Normal small bowel without abdominal ascites.  Adrendals/Urinary tract: Normal adrenal glands. 2.4 cm lower pole left renal cyst. Mild right-sided hydroureteronephrosis. This continues to the level of a punctate stone either at or just exiting the right ureteral vesicular junction. Image 92 of series 2. Bladder otherwise within normal limits.  Vascular/Lymphatic: No aneurysm. No abdominopelvic adenopathy.  Reproductive: Normal prostate, without significant free pelvic fluid.  Musculoskeletal: Hemangioma within the right side of the L4 vertebral body. Mild disc bulge at L1-2.  Other:  None  IMPRESSION: 1. Mild right-sided  hydroureteronephrosis secondary to a punctate stone which is either at or just exiting the right ureterovesicular junction. 2. Normal appendix, without other explanation for right-sided pain. 3. Equivocal soft tissue fullness at the rectosigmoid junction. This could be due to underdistention or correspond with the clinical history of adenocarcinoma. 4. Nonspecific minimal nodularity at the right lung base. Most likely likely a subpleural lymph node. Recommend attention on followup in this patient with the clinical history of primary malignancy.   Electronically Signed   By: Abigail Miyamoto M.D.   On: 08/11/2014 18:07      EKG Interpretation None      MDM   Final diagnoses:  Kidney stone  Right lower quadrant abdominal pain    Patient with right flank and RLQ tenderness.  Will treat pain.  Plan for CT.   CT as above. Remarkable for right-sided hydronephrosis. Pain is well-controlled and ED. Will treat patient with pain medicine and nausea medicine for kidney stone. Additionally, recommend primary care followup for the finding of nonspecific nodularity of the right lung base. I discussed these findings with the patient and list of them on his discharge paperwork. He understands and agrees with the plan. Patient is stable and ready for discharge.   Montine Circle, PA-C 08/11/14 1830

## 2014-08-11 NOTE — ED Notes (Signed)
Per pt, states RLQ pain since Sunday-increased pain and vomiting today

## 2014-08-11 NOTE — Discharge Instructions (Signed)
Your CT scan shows evidence of a right-sided kidney stone.  This should pass on its own.  If it does not pass in the next 2-3 days, please follow-up with urology.  Additionally, the CT scan showed a small 3 mm nodularity at the right lung base.  This could be a subpleural lymph node, but needs to be evaluated in further detail.  Please schedule a follow-up appointment with your primary care provider to investigate this finding in greater detail.  Kidney Stones Kidney stones (urolithiasis) are deposits that form inside your kidneys. The intense pain is caused by the stone moving through the urinary tract. When the stone moves, the ureter goes into spasm around the stone. The stone is usually passed in the urine.  CAUSES   A disorder that makes certain neck glands produce too much parathyroid hormone (primary hyperparathyroidism).  A buildup of uric acid crystals, similar to gout in your joints.  Narrowing (stricture) of the ureter.  A kidney obstruction present at birth (congenital obstruction).  Previous surgery on the kidney or ureters.  Numerous kidney infections. SYMPTOMS   Feeling sick to your stomach (nauseous).  Throwing up (vomiting).  Blood in the urine (hematuria).  Pain that usually spreads (radiates) to the groin.  Frequency or urgency of urination. DIAGNOSIS   Taking a history and physical exam.  Blood or urine tests.  CT scan.  Occasionally, an examination of the inside of the urinary bladder (cystoscopy) is performed. TREATMENT   Observation.  Increasing your fluid intake.  Extracorporeal shock wave lithotripsy--This is a noninvasive procedure that uses shock waves to break up kidney stones.  Surgery may be needed if you have severe pain or persistent obstruction. There are various surgical procedures. Most of the procedures are performed with the use of small instruments. Only small incisions are needed to accommodate these instruments, so recovery time  is minimized. The size, location, and chemical composition are all important variables that will determine the proper choice of action for you. Talk to your health care provider to better understand your situation so that you will minimize the risk of injury to yourself and your kidney.  HOME CARE INSTRUCTIONS   Drink enough water and fluids to keep your urine clear or pale yellow. This will help you to pass the stone or stone fragments.  Strain all urine through the provided strainer. Keep all particulate matter and stones for your health care provider to see. The stone causing the pain may be as small as a grain of salt. It is very important to use the strainer each and every time you pass your urine. The collection of your stone will allow your health care provider to analyze it and verify that a stone has actually passed. The stone analysis will often identify what you can do to reduce the incidence of recurrences.  Only take over-the-counter or prescription medicines for pain, discomfort, or fever as directed by your health care provider.  Make a follow-up appointment with your health care provider as directed.  Get follow-up X-rays if required. The absence of pain does not always mean that the stone has passed. It may have only stopped moving. If the urine remains completely obstructed, it can cause loss of kidney function or even complete destruction of the kidney. It is your responsibility to make sure X-rays and follow-ups are completed. Ultrasounds of the kidney can show blockages and the status of the kidney. Ultrasounds are not associated with any radiation and can be performed easily  in a matter of minutes. SEEK MEDICAL CARE IF:  You experience pain that is progressive and unresponsive to any pain medicine you have been prescribed. SEEK IMMEDIATE MEDICAL CARE IF:   Pain cannot be controlled with the prescribed medicine.  You have a fever or shaking chills.  The severity or  intensity of pain increases over 18 hours and is not relieved by pain medicine.  You develop a new onset of abdominal pain.  You feel faint or pass out.  You are unable to urinate. MAKE SURE YOU:   Understand these instructions.  Will watch your condition.  Will get help right away if you are not doing well or get worse. Document Released: 11/06/2005 Document Revised: 07/09/2013 Document Reviewed: 04/09/2013 [facility] Patient Information 2015 [place], [place]. This information is not intended to replace advice given to you by your health care provider. Make sure you discuss any questions you have with your health care provider.

## 2014-08-12 ENCOUNTER — Telehealth: Payer: Self-pay | Admitting: Internal Medicine

## 2014-08-12 DIAGNOSIS — N200 Calculus of kidney: Secondary | ICD-10-CM | POA: Insufficient documentation

## 2014-08-12 NOTE — Telephone Encounter (Signed)
Left message regarding nodular abnormality right lung base. Suggest limited CT o fchest right lung base to evaluate once kidney stone has resolved.

## 2014-08-13 NOTE — ED Provider Notes (Signed)
Medical screening examination/treatment/procedure(s) were performed by non-physician practitioner and as supervising physician I was immediately available for consultation/collaboration.   EKG Interpretation None        Tanna Furry, MD 08/13/14 [phone]

## 2014-08-18 ENCOUNTER — Telehealth: Payer: Self-pay

## 2014-08-18 NOTE — Telephone Encounter (Signed)
Call [facility] and see if kidney stone can be analyzed there. Most of them are calcium carbonate but not all of them are. Would prefer he see Urologist for management in case he develops future stone  with complication. He may have more stones down the road.   Drink lots of water and avoid sodas and tea. Please mail him copy of CT scan for his review. This is Dr. Minus Liberty, Animal nutritionist.

## 2014-08-18 NOTE — Telephone Encounter (Signed)
Patient called.  He would like to know if he can drop his kidney stone off to have it analyzed.  He does not want to go to a urologist.  He was informed of chest CT, but wants kidney stone analyzed first.  Is this something he can bring to the office or does he need to see a urologist?

## 2014-08-19 NOTE — Telephone Encounter (Signed)
Per [facility] kidney stone can be evaluated using code 81350.  Patient wife aware and will drop stone off.

## 2014-08-28 ENCOUNTER — Encounter: Payer: Self-pay | Admitting: Internal Medicine

## 2014-08-28 ENCOUNTER — Telehealth: Payer: Self-pay | Admitting: Internal Medicine

## 2014-08-28 ENCOUNTER — Ambulatory Visit (INDEPENDENT_AMBULATORY_CARE_PROVIDER_SITE_OTHER): Payer: BC Managed Care – PPO | Admitting: Internal Medicine

## 2014-08-28 VITALS — BP 124/80 | HR 74 | Temp 97.5°F | Wt 197.0 lb

## 2014-08-28 DIAGNOSIS — Z23 Encounter for immunization: Secondary | ICD-10-CM

## 2014-08-28 DIAGNOSIS — N200 Calculus of kidney: Secondary | ICD-10-CM

## 2014-08-28 NOTE — Telephone Encounter (Signed)
Discussed recent CT with question of subpleural lymph node with Radiologist. An addendum is being done.

## 2014-09-02 LAB — STONE ANALYSIS: Stone Weight KSTONE: 0.006 g

## 2014-10-17 NOTE — Progress Notes (Signed)
   Subjective:    Patient ID: John Hunt, male    DOB: [birth year], 53 y.o.   MRN: [id number]  HPI  Patient was seen in the emergency department September 22 with right lower quadrant abdominal pain. He was found to have a kidney stone at the UV junction. He is subsequently passed the stone and brings it in today for analysis. Explained to him that most stones in this area are calcium oxalate. Talked with him about increasing water consumption. He doesn't drink soft drinks. Decreased T consumption.  CT scan also raised question of a subpleural lymph node 3 mm in size. Have called radiologist to discuss this. Feels that it is benign lesion. I think they were concerned because of his history of colon polyp showing early adenocarcinoma. However they do not think this is metastatic disease.    Review of Systems     Objective:   Physical Exam   Patient not examined. Spent 25 minutes speaking with him about these issues     Assessment & Plan:  Right kidney stone-subsequently passed and will be sent for analysis at his request  4 mm subpleural lymph node-discussed with radiologist  Benign hepatic cyst  Benign renal cyst  Plan: Return as needed.

## 2014-10-29 ENCOUNTER — Telehealth: Payer: Self-pay

## 2014-10-29 NOTE — Telephone Encounter (Signed)
Left message to call me back to discuss colon recall , needs to be set up for pre-visit and colonoscopy per Dr Silvano Rusk. Hx of colon rectal cancer.

## 2014-11-02 ENCOUNTER — Encounter: Payer: Self-pay | Admitting: Internal Medicine

## 2014-11-02 NOTE — Telephone Encounter (Signed)
Spoke with Dr. Minus Liberty and he may change insurance in the coming year so he will call back and set up pre-visit and colonoscopy appointments.  I told him if he has any problems getting on the books to ask for PJ or Sheri.

## 2014-12-03 ENCOUNTER — Encounter: Payer: Self-pay | Admitting: Internal Medicine

## 2015-02-22 ENCOUNTER — Encounter: Payer: Self-pay | Admitting: Internal Medicine

## 2015-03-01 ENCOUNTER — Telehealth: Payer: Self-pay

## 2015-03-01 NOTE — Telephone Encounter (Signed)
Left message for patient to call back  

## 2015-03-01 NOTE — Telephone Encounter (Signed)
-----   Message from Gatha Mayer, MD sent at 02/26/2015  2:37 PM EDT ----- Regarding: needs colonoscopy He had postponed his recall - please see if he is ready to set up colonoscopy - hx CRCA

## 2015-03-03 NOTE — Telephone Encounter (Signed)
John Hunt - place for nov understand

## 2015-03-03 NOTE — Telephone Encounter (Signed)
Recall placed

## 2015-03-03 NOTE — Telephone Encounter (Signed)
I spoke with the patient. He reports he would like to schedule, but last year he had a kidney stone and it was $7,000.  He reports that he has an $11,000 deductible.  He is making payments on his kidney stone of almost $800 a month.  He wants to wait until he has this paid off before he schedules his colonoscopy.  Dr. Carlean Purl ok to place a recall for December of this year?

## 2015-11-01 ENCOUNTER — Encounter: Payer: Self-pay | Admitting: Internal Medicine

## 2016-04-19 ENCOUNTER — Encounter: Payer: Self-pay | Admitting: Internal Medicine

## 2016-05-01 IMAGING — CT CT ABD-PELV W/ CM
1 of 3 series · 13 of 32 positions shown, 18 images · IV contrast (OMNIPAQUE 300)
Comparison: None.

ADDENDUM:
Per discussion with Dr. Sakpaku, patient has no history of metastatic
disease. Typically, in patients with primary malignancy, Fleischner
criteria do not apply and small pulmonary nodules are reevaluated on
any follow-up imaging obtained in the course of following the
patient's primary malignancy. In the absence of primary malignancy,
if the patient has risk factors for bronchogenic carcinoma, followup
CT chest without contrast could be obtained in 1 year. The 4 mm
nodule along the minor fissure is most likely a subpleural lymph
node.
CLINICAL DATA: Right lower quadrant pain for 3 days. Worsening pain
with vomiting today. History of adenocarcinoma of rectosigmoid
junction.

EXAM:
CT ABDOMEN AND PELVIS WITH CONTRAST
TECHNIQUE: Multidetector CT imaging of the abdomen and pelvis was performed
using the standard protocol following bolus administration of
intravenous contrast.
CONTRAST:  100mL OMNIPAQUE IOHEXOL 300 MG/ML  SOLN

[Series 2: abd/pel with · axial · 0.78mm/px · z∈[+1118,+1588]mm · 13 of 106 slices shown, 18 images]
[im 6/106  soft-tissue]
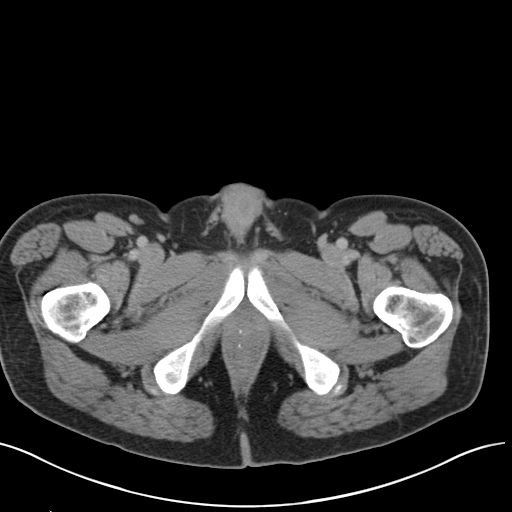
[im 6/106  bone]
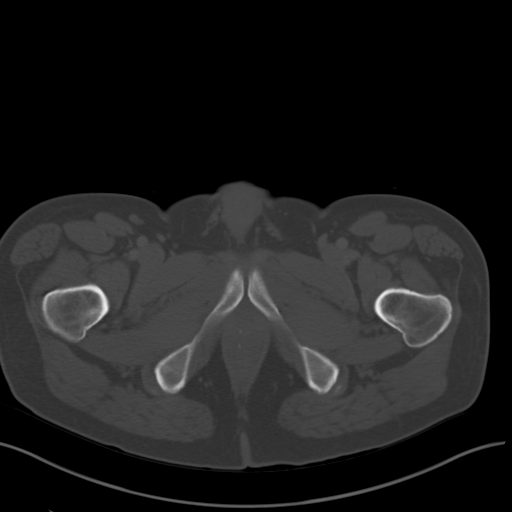
[im 18/106  soft-tissue]
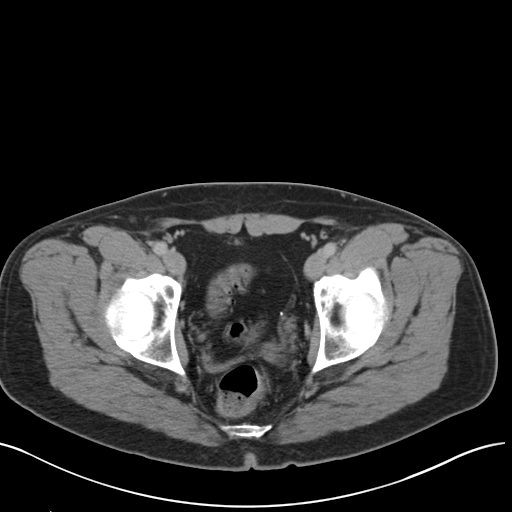
[im 24/106  soft-tissue]
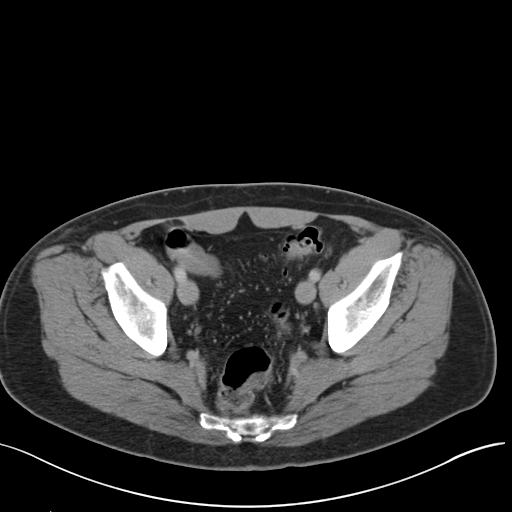
[im 30/106  soft-tissue]
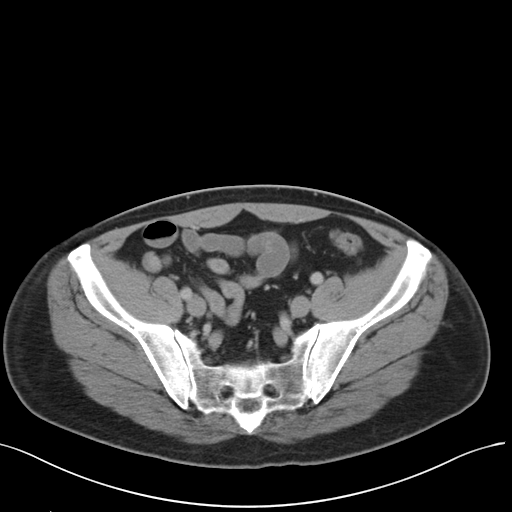
[im 41/106  soft-tissue]
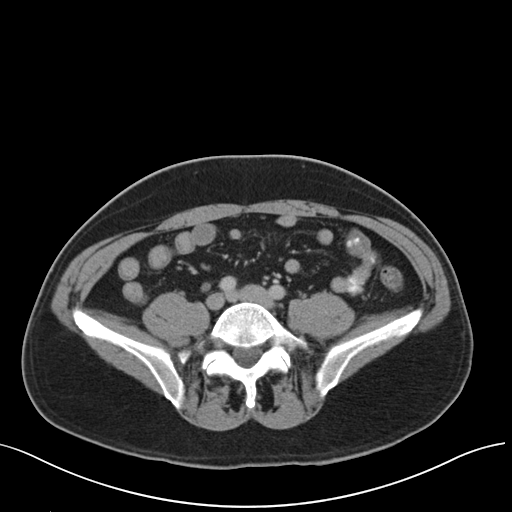
[im 47/106  soft-tissue]
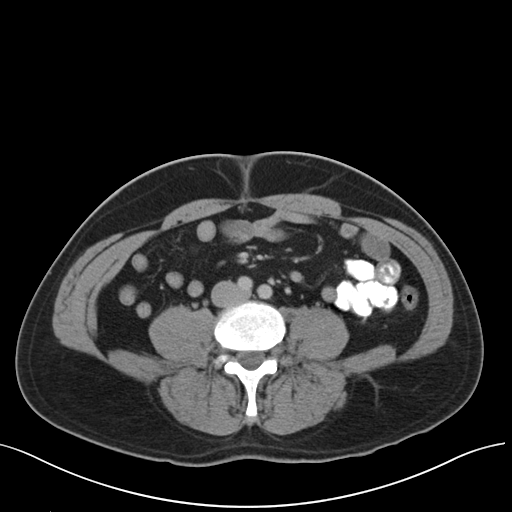
[im 59/106  soft-tissue]
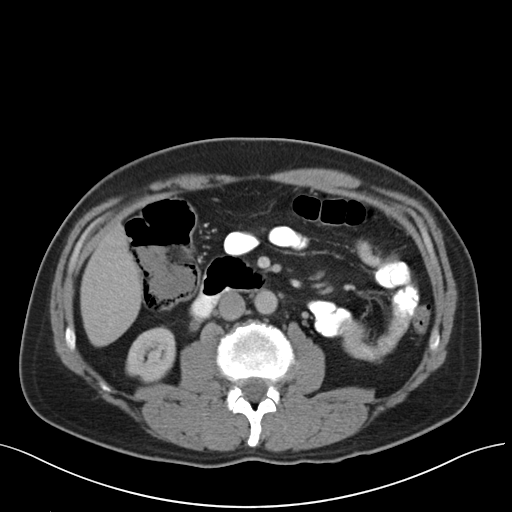
[im 65/106  soft-tissue]
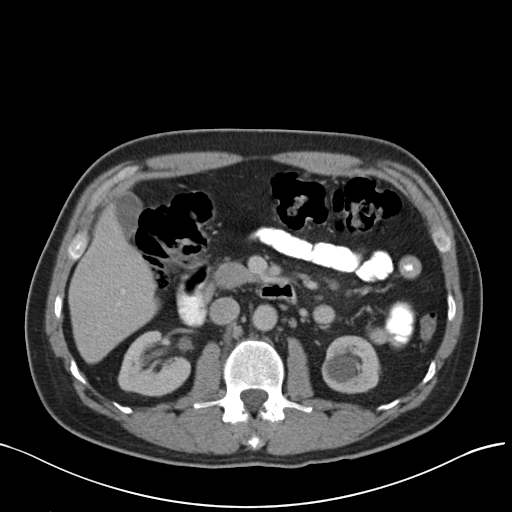
[im 76/106  soft-tissue]
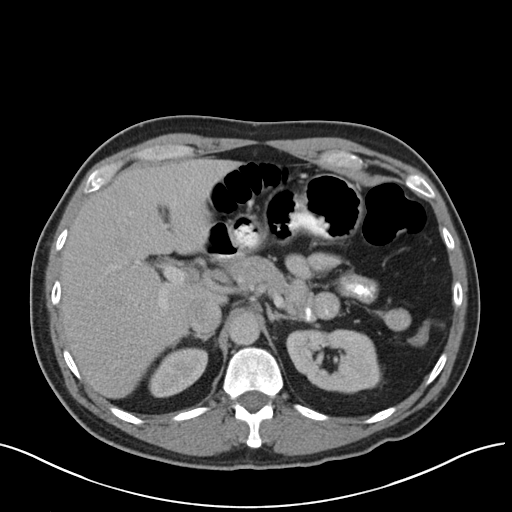
[im 76/106  bone]
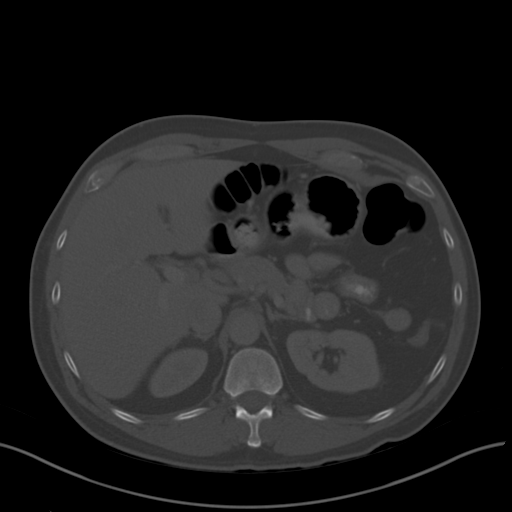
[im 82/106  soft-tissue]
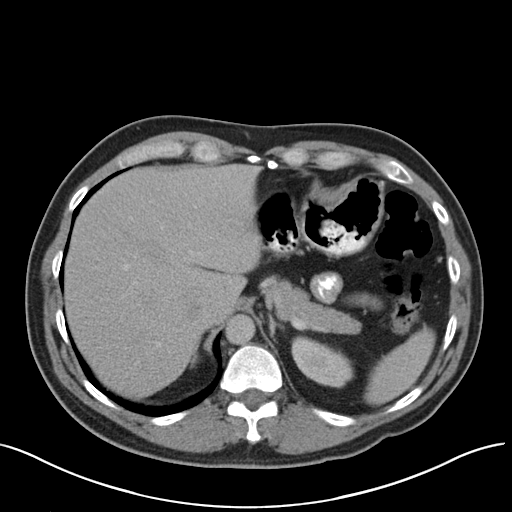
[im 82/106  lung]
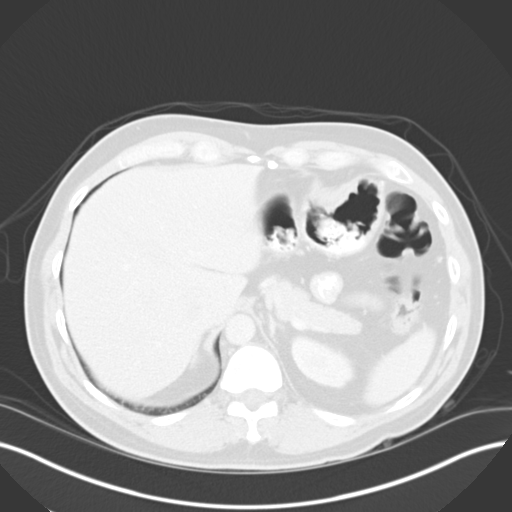
[im 88/106  soft-tissue]
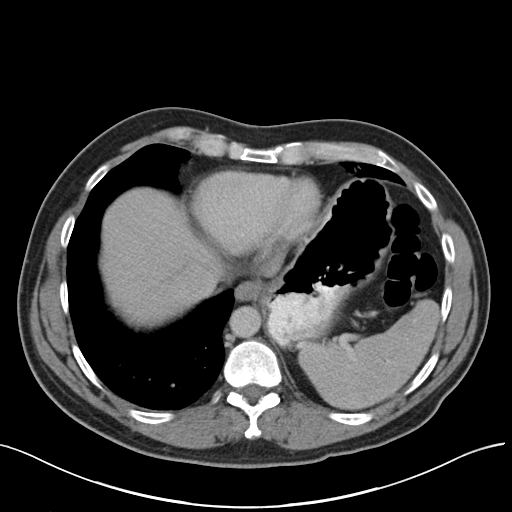
[im 88/106  lung]
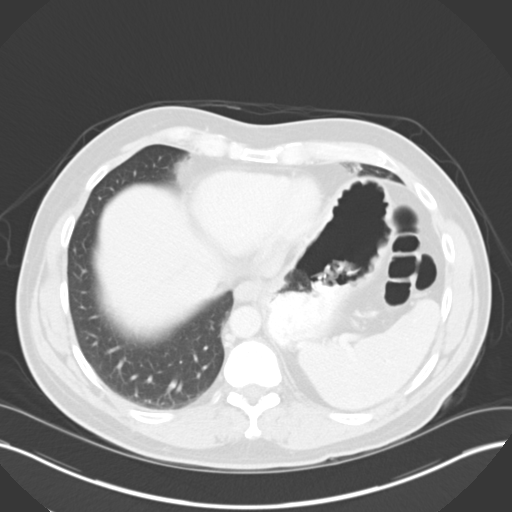
[im 94/106  lung]
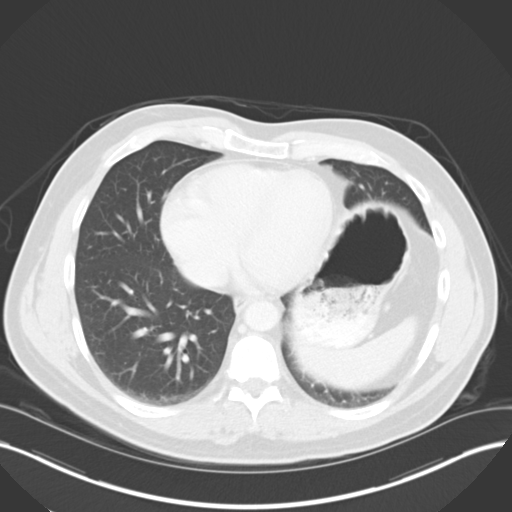
[im 100/106  soft-tissue]
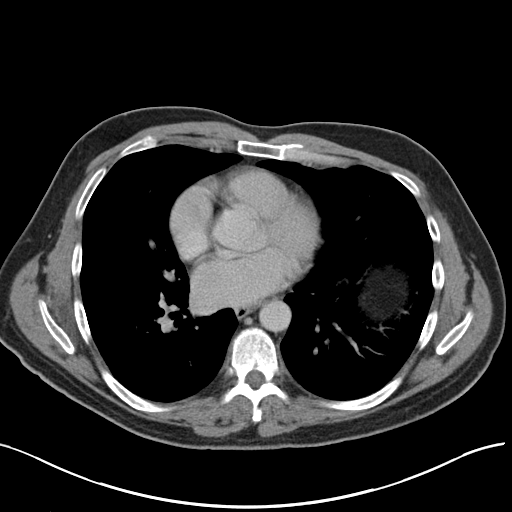
[im 100/106  lung]
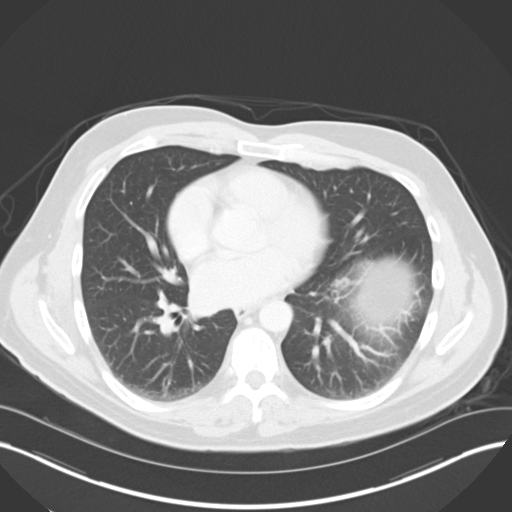

[13 of 32 positions shown; findings below may reference images not displayed]

FINDINGS: Lower chest: Perifissural 3 mm right lung base nodule on image 2.
Volume loss of the left hemidiaphragm secondary to moderate left
hemidiaphragm elevation. Normal heart size without pericardial or
pleural effusion.

Hepatobiliary: Central left hepatic lobe cyst of 11 mm. A too small
to characterize right liver lobe lesion is also likely a cyst.
Normal gallbladder, without biliary ductal dilatation.

Spleen: Normal

Pancreas: Normal, without mass or pancreatic ductal dilatation.

Stomach/Bowel: Normal stomach, without wall thickening. Sigmoid
diverticulosis. Equivocal soft tissue fullness at the rectosigmoid
junction, including on image 85. Normal terminal ileum and appendix.
Normal small bowel without abdominal ascites.

Adrendals/Urinary tract: Normal adrenal glands. 2.4 cm lower pole
left renal cyst. Mild right-sided hydroureteronephrosis. This
continues to the level of a punctate stone either at or just exiting
the right ureteral vesicular junction. Image 92 of series 2. Bladder
otherwise within normal limits.

Vascular/Lymphatic: No aneurysm. No abdominopelvic adenopathy.

Reproductive: Normal prostate, without significant free pelvic
fluid.

Musculoskeletal: Hemangioma within the right side of the L4
vertebral body. Mild disc bulge at L1-2.

Other:  None
IMPRESSION: 1. Mild right-sided hydroureteronephrosis secondary to a punctate
stone which is either at or just exiting the right ureterovesicular
junction.
2. Normal appendix, without other explanation for right-sided pain.
3. Equivocal soft tissue fullness at the rectosigmoid junction. This
could be due to underdistention or correspond with the clinical
history of adenocarcinoma.
4. Nonspecific minimal nodularity at the right lung base. Most
likely likely a subpleural lymph node. Recommend attention on
followup in this patient with the clinical history of primary
malignancy.

## 2016-06-09 ENCOUNTER — Encounter: Payer: Self-pay | Admitting: *Deleted

## 2016-06-09 ENCOUNTER — Encounter: Payer: Self-pay | Admitting: Internal Medicine

## 2016-06-09 ENCOUNTER — Ambulatory Visit (AMBULATORY_SURGERY_CENTER): Payer: Self-pay | Admitting: *Deleted

## 2016-06-09 VITALS — Ht 73.0 in | Wt 195.2 lb

## 2016-06-09 DIAGNOSIS — Z86010 Personal history of colonic polyps: Secondary | ICD-10-CM

## 2016-06-09 NOTE — Progress Notes (Signed)
Patient denies any allergies to egg or soy products. Patient denies complications with anesthesia/sedation.  Patient denies oxygen use at home and denies diet medications. Emmi instructions for colonoscopy explained but patient denied.  Denied Pamphlet.

## 2016-06-15 ENCOUNTER — Encounter: Payer: Self-pay | Admitting: Internal Medicine

## 2016-06-15 ENCOUNTER — Ambulatory Visit (AMBULATORY_SURGERY_CENTER): Payer: BLUE CROSS/BLUE SHIELD | Admitting: Internal Medicine

## 2016-06-15 VITALS — BP 112/72 | HR 59 | Temp 97.5°F | Resp 10 | Ht 73.0 in | Wt 195.0 lb

## 2016-06-15 DIAGNOSIS — Z86010 Personal history of colonic polyps: Secondary | ICD-10-CM | POA: Diagnosis not present

## 2016-06-15 NOTE — Op Note (Signed)
[place] Patient Name: John Hunt Procedure Date: 06/15/2016 8:31 AM MRN: [id number] Endoscopist: Gatha Mayer , MD Age: 55 Referring MD:  Date of Birth: November 06, 1961 Gender: Male Account #: [id number] Procedure:                Colonoscopy Indications:              High risk colon cancer surveillance: Personal                            history of colon cancer Medicines:                Propofol per Anesthesia, Monitored Anesthesia Care Procedure:                Pre-Anesthesia Assessment:                           - Prior to the procedure, a History and Physical                            was performed, and patient medications and                            allergies were reviewed. The patient's tolerance of                            previous anesthesia was also reviewed. The risks                            and benefits of the procedure and the sedation                            options and risks were discussed with the patient.                            All questions were answered, and informed consent                            was obtained. Prior Anticoagulants: The patient has                            taken no previous anticoagulant or antiplatelet                            agents. ASA Grade Assessment: II - A patient with                            mild systemic disease. After reviewing the risks                            and benefits, the patient was deemed in                            satisfactory condition to undergo the procedure.  After obtaining informed consent, the colonoscope                            was passed under direct vision. Throughout the                            procedure, the patient's blood pressure, pulse, and                            oxygen saturations were monitored continuously. The                            [id number] was introduced through the anus                            and advanced to  the the cecum, identified by                            appendiceal orifice and ileocecal valve. The                            ileocecal valve, appendiceal orifice, and rectum                            were photographed. The quality of the bowel                            preparation was excellent. The bowel preparation                            used was Miralax. Scope In: 8:38:37 AM Scope Out: 8:48:40 AM Scope Withdrawal Time: 0 hours 7 minutes 41 seconds  Total Procedure Duration: 0 hours 10 minutes 3 seconds  Findings:                 The perianal and digital rectal examinations were                            normal. Pertinent negatives include normal prostate                            (size, shape, and consistency).                           Many diverticula were found in the sigmoid colon.                            There was no evidence of diverticular bleeding.                           The exam was otherwise without abnormality on                            direct and retroflexion views. Complications:  No immediate complications. Estimated blood loss:                            None. Estimated Blood Loss:     Estimated blood loss: none. Impression:               - Severe diverticulosis in the sigmoid colon. There                            was no evidence of diverticular bleeding.                           - The examination was otherwise normal on direct                            and retroflexion views.                           - No specimens collected.                           - Personal history of colonic cancer removed by                            snare polypectomy 2014 Recommendation:           - Repeat colonoscopy in 5 years for surveillance.                           - Resume previous diet.                           - Continue present medications.                           - Patient has a contact number available for                             emergencies. The signs and symptoms of potential                            delayed complications were discussed with the                            patient. Return to normal activities tomorrow.                            Written discharge instructions were provided to the                            patient. Gatha Mayer, MD 06/15/2016 8:53:34 AM This report has been signed electronically.

## 2016-06-15 NOTE — Progress Notes (Signed)
Report to PACU, RN, vss, BBS= Clear.  

## 2016-06-15 NOTE — Patient Instructions (Addendum)
No polyps today - repeat colonoscopy 2020. You do have diverticulosis - thickened muscle rings and pouches in the colon wall. Please read the handout about this condition.  I appreciate the opportunity to care for you. Gatha Mayer, MD, [facility]   Discharge instructions given. Handout on diverticulosis. Resume previous medications. YOU HAD AN ENDOSCOPIC PROCEDURE TODAY AT [facility]:   Refer to the procedure report that was given to you for any specific questions about what was found during the examination.  If the procedure report does not answer your questions, please call your gastroenterologist to clarify.  If you requested that your care partner not be given the details of your procedure findings, then the procedure report has been included in a sealed envelope for you to review at your convenience later.  YOU SHOULD EXPECT: Some feelings of bloating in the abdomen. Passage of more gas than usual.  Walking can help get rid of the air that was put into your GI tract during the procedure and reduce the bloating. If you had a lower endoscopy (such as a colonoscopy or flexible sigmoidoscopy) you may notice spotting of blood in your stool or on the toilet paper. If you underwent a bowel prep for your procedure, you may not have a normal bowel movement for a few days.  Please Note:  You might notice some irritation and congestion in your nose or some drainage.  This is from the oxygen used during your procedure.  There is no need for concern and it should clear up in a day or so.  SYMPTOMS TO REPORT IMMEDIATELY:   Following lower endoscopy (colonoscopy or flexible sigmoidoscopy):  Excessive amounts of blood in the stool  Significant tenderness or worsening of abdominal pains  Swelling of the abdomen that is new, acute  Fever of 100F or higher   For urgent or emergent issues, a gastroenterologist can be reached at any hour by calling [phone].   DIET: Your  first meal following the procedure should be a small meal and then it is ok to progress to your normal diet. Heavy or fried foods are harder to digest and may make you feel nauseous or bloated.  Likewise, meals heavy in dairy and vegetables can increase bloating.  Drink plenty of fluids but you should avoid alcoholic beverages for 24 hours.  ACTIVITY:  You should plan to take it easy for the rest of today and you should NOT DRIVE or use heavy machinery until tomorrow (because of the sedation medicines used during the test).    FOLLOW UP: Our staff will call the number listed on your records the next business day following your procedure to check on you and address any questions or concerns that you may have regarding the information given to you following your procedure. If we do not reach you, we will leave a message.  However, if you are feeling well and you are not experiencing any problems, there is no need to return our call.  We will assume that you have returned to your regular daily activities without incident.  If any biopsies were taken you will be contacted by phone or by letter within the next 1-3 weeks.  Please call us at [phone] if you have not heard about the biopsies in 3 weeks.    SIGNATURES/CONFIDENTIALITY: You and/or your care partner have signed paperwork which will be entered into your electronic medical record.  These signatures attest to the fact that that the  information above on your After Visit Summary has been reviewed and is understood.  Full responsibility of the confidentiality of this discharge information lies with you and/or your care-partner.

## 2016-06-16 ENCOUNTER — Telehealth: Payer: Self-pay | Admitting: *Deleted

## 2016-06-16 NOTE — Telephone Encounter (Signed)
No answer, or answering macine.  Unable to leave message.

## 2018-11-06 DIAGNOSIS — J069 Acute upper respiratory infection, unspecified: Secondary | ICD-10-CM | POA: Diagnosis not present

## 2018-11-06 DIAGNOSIS — J209 Acute bronchitis, unspecified: Secondary | ICD-10-CM | POA: Diagnosis not present

## 2018-11-21 ENCOUNTER — Encounter: Payer: Self-pay | Admitting: Internal Medicine

## 2018-11-21 ENCOUNTER — Ambulatory Visit
Admission: RE | Admit: 2018-11-21 | Discharge: 2018-11-21 | Disposition: A | Discharge status: Home / Self Care | Payer: BLUE CROSS/BLUE SHIELD | Source: Ambulatory Visit | Attending: Internal Medicine | Admitting: Internal Medicine

## 2018-11-21 ENCOUNTER — Ambulatory Visit (INDEPENDENT_AMBULATORY_CARE_PROVIDER_SITE_OTHER): Payer: BLUE CROSS/BLUE SHIELD | Admitting: Internal Medicine

## 2018-11-21 VITALS — BP 120/88 | HR 56 | Temp 98.2°F | Ht 73.0 in | Wt 199.0 lb

## 2018-11-21 DIAGNOSIS — R059 Cough, unspecified: Secondary | ICD-10-CM

## 2018-11-21 DIAGNOSIS — R05 Cough: Secondary | ICD-10-CM | POA: Diagnosis not present

## 2018-11-21 DIAGNOSIS — J22 Unspecified acute lower respiratory infection: Secondary | ICD-10-CM | POA: Diagnosis not present

## 2018-11-21 MED ORDER — DOXYCYCLINE HYCLATE 100 MG PO TABS: 100.0000 mg | ORAL_TABLET | Freq: Two times a day (BID) | ORAL | 0 refills | Status: DC

## 2018-11-21 MED ORDER — HYDROCODONE-HOMATROPINE 5-1.5 MG/5ML PO SYRP: 5.0000 mL | ORAL_SOLUTION | Freq: Three times a day (TID) | ORAL | 0 refills | Status: DC | PRN

## 2018-11-21 MED ORDER — PREDNISONE 10 MG PO TABS: ORAL_TABLET | ORAL | 0 refills | Status: DC

## 2018-11-21 NOTE — Progress Notes (Signed)
   Subjective:    Patient ID: BURGESS SHERIFF, adult    DOB: 08-Apr-1961, 58 y.o.   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  Not seen here since 2015.  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  He thought symptoms would resolve with time but has become concerned because he continues to cough.  Cough is slightly productive.  No hemoptysis.  No documented fever or shaking chills currently.    Review of Systems see above     Objective:   Physical Exam Skin warm and dry.  Nodes none.  TMs and pharynx are clear.  Neck is supple without adenopathy.  Chest is clear to auscultation without rales or wheezing.       Assessment & Plan:  Impression: Protracted lower respiratory infection   Doxycycline 100 mg twice daily for 10 days  Hycodan 1 teaspoon p.o. daily 8 hours as needed cough  Prednisone 10 mg to take in tapering course going from 60 mg to 0 mg over 7 days  Had chest x-ray  Chest x-ray shows left hemidiaphragm elevation which is been noted on previous exam.  No pneumonia.

## 2018-12-04 DIAGNOSIS — Z23 Encounter for immunization: Secondary | ICD-10-CM | POA: Diagnosis not present

## 2018-12-14 NOTE — Patient Instructions (Signed)
Chest x-ray shows elevation of left hemidiaphragm which is been noted on previous films.  Doxycycline 100 mg twice daily for 10 days.  Take prednisone and tapering course as directed and Hycodan as needed for cough.  Call if not better in 2 weeks.

## 2019-09-05 DIAGNOSIS — Z23 Encounter for immunization: Secondary | ICD-10-CM | POA: Diagnosis not present

## 2019-11-26 DIAGNOSIS — M545 Low back pain: Secondary | ICD-10-CM | POA: Diagnosis not present

## 2019-11-26 DIAGNOSIS — M25511 Pain in right shoulder: Secondary | ICD-10-CM | POA: Diagnosis not present

## 2020-01-24 ENCOUNTER — Ambulatory Visit: Payer: BC Managed Care – PPO | Attending: Internal Medicine

## 2020-01-24 DIAGNOSIS — Z23 Encounter for immunization: Secondary | ICD-10-CM

## 2020-01-24 NOTE — Progress Notes (Signed)
   Covid-19 Vaccination Clinic  Name:  John Hunt    MRN: [id number] DOB: November 07, 1961  01/24/2020  John Hunt was observed post Covid-19 immunization for 15 minutes without incident. He was provided with Vaccine Information Sheet and instruction to access the V-Safe system.   John Hunt was instructed to call 911 with any severe reactions post vaccine: Marland Kitchen Difficulty breathing  . Swelling of face and throat  . A fast heartbeat  . A bad rash all over body  . Dizziness and weakness   Immunizations Administered    Name Date Dose VIS Date Route   Pfizer COVID-19 Vaccine 01/24/2020  3:17 PM 0.3 mL 10/31/2019 Intramuscular   Manufacturer: [place]   Lot: [id number]   [place]: [id number]

## 2020-02-14 ENCOUNTER — Ambulatory Visit: Payer: BC Managed Care – PPO | Attending: Internal Medicine

## 2020-02-14 DIAGNOSIS — Z23 Encounter for immunization: Secondary | ICD-10-CM

## 2020-02-14 NOTE — Progress Notes (Signed)
   Covid-19 Vaccination Clinic  Name:  John Hunt    MRN: [id number] DOB: 01/21/1961  02/14/2020  John Hunt was observed post Covid-19 immunization for 15 minutes without incident. He was provided with Vaccine Information Sheet and instruction to access the V-Safe system.   John Hunt was instructed to call 911 with any severe reactions post vaccine: Marland Kitchen Difficulty breathing  . Swelling of face and throat  . A fast heartbeat  . A bad rash all over body  . Dizziness and weakness   Immunizations Administered    Name Date Dose VIS Date Route   Pfizer COVID-19 Vaccine 02/14/2020  1:36 PM 0.3 mL 10/31/2019 Intramuscular   Manufacturer: [place]   Lot: [id number]   [place]: [id number]

## 2020-02-25 ENCOUNTER — Ambulatory Visit: Payer: BC Managed Care – PPO

## 2020-08-11 IMAGING — CR DG CHEST 2V
2 series · 2 of 2 positions shown · non-contrast
Comparison: Radiograph June 13, 2004.

CLINICAL DATA: Cough.

EXAM:
CHEST - 2 VIEW

[w chest pa]
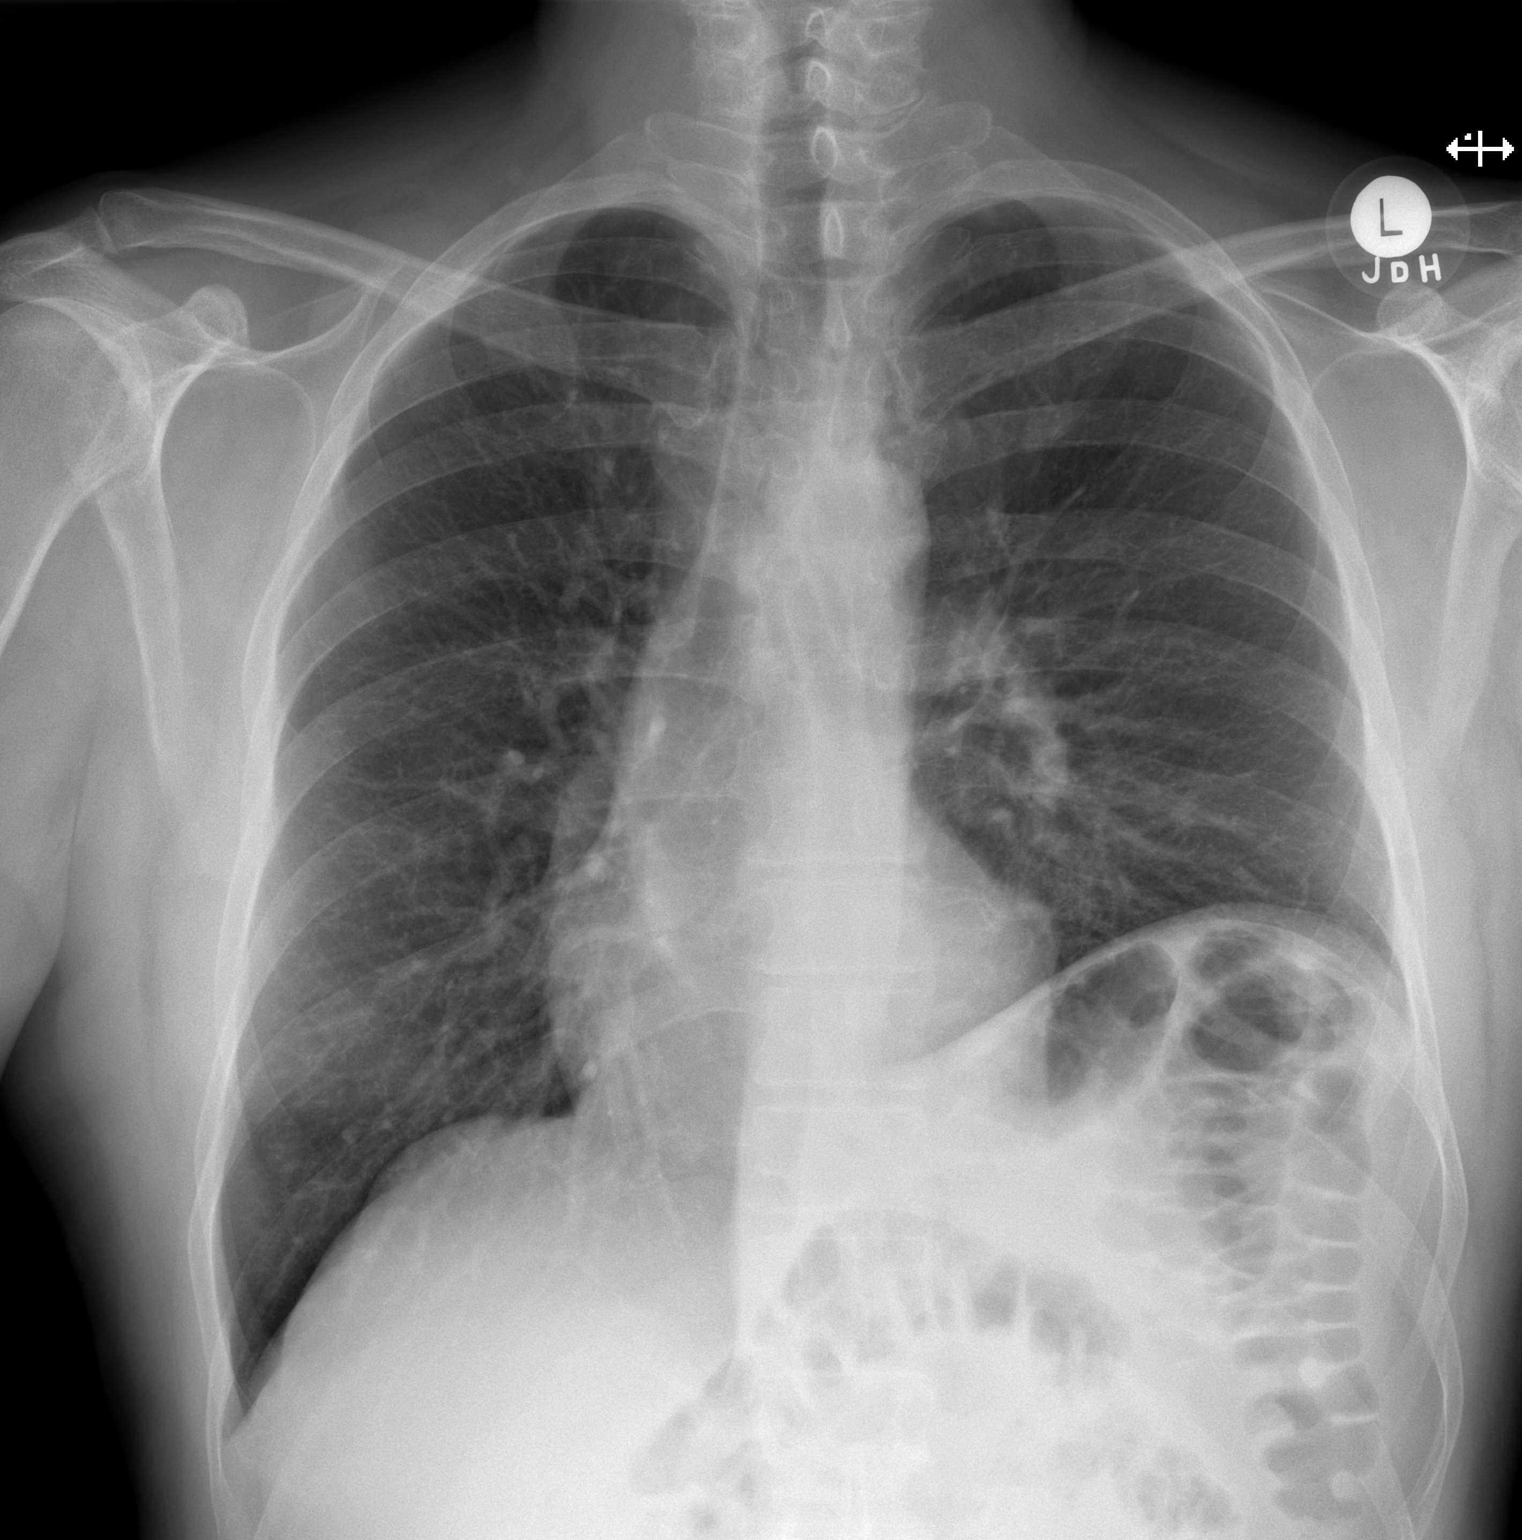

[w chest lat]
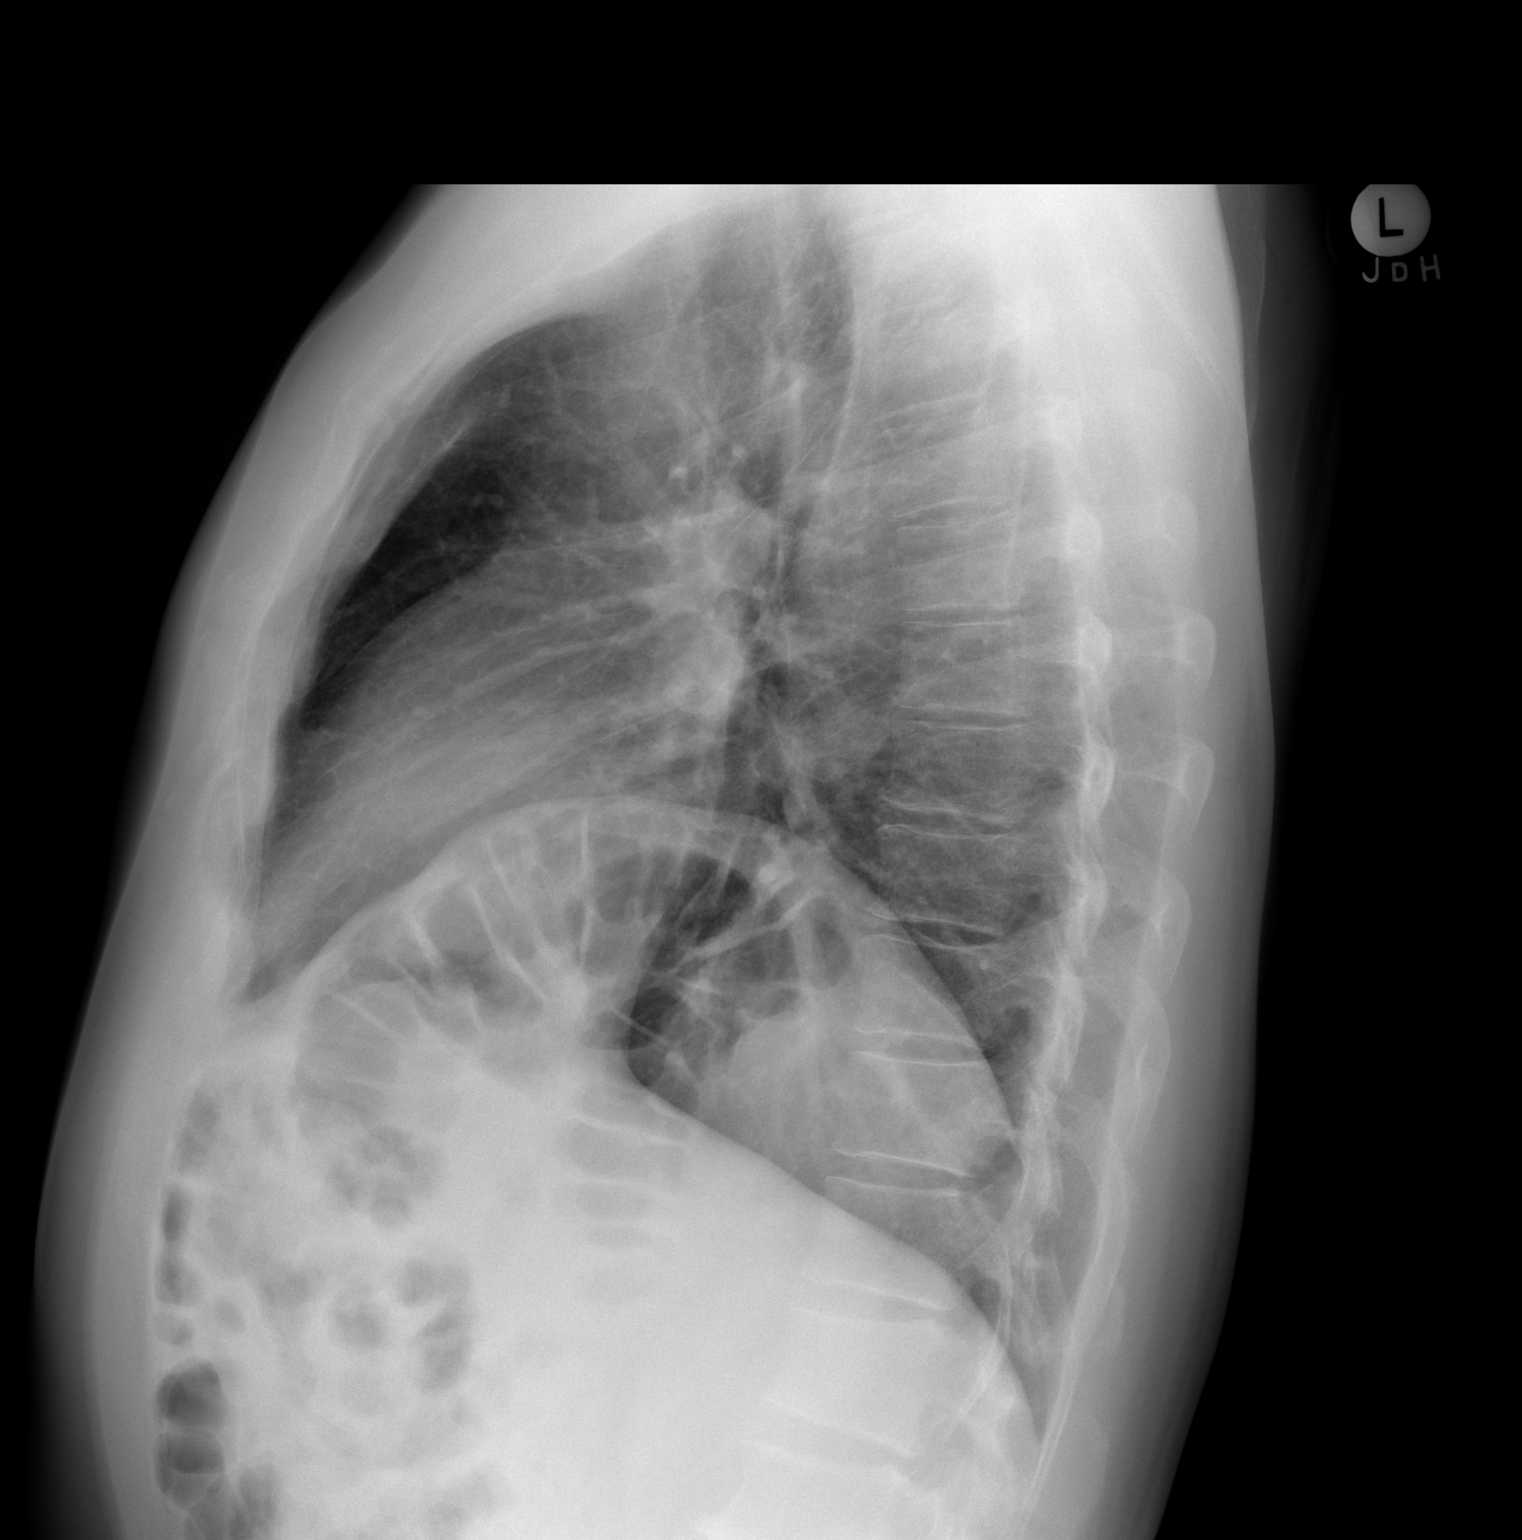

[2 of 2 positions shown; findings below may reference images not displayed]

FINDINGS: The heart size and mediastinal contours are within normal limits.
Both lungs are clear. No pneumothorax or pleural effusion is noted.
Increased elevation of left hemidiaphragm is noted compared to prior
exam. The visualized skeletal structures are unremarkable.
IMPRESSION: No active cardiopulmonary disease. Increased elevation of left
hemidiaphragm is noted compared to prior exam; left diaphragmatic
paralysis can not be excluded and fluoroscopic sniff test is
recommended for further evaluation.

## 2020-08-20 ENCOUNTER — Other Ambulatory Visit: Payer: Self-pay

## 2020-08-20 ENCOUNTER — Ambulatory Visit (INDEPENDENT_AMBULATORY_CARE_PROVIDER_SITE_OTHER): Payer: BC Managed Care – PPO | Admitting: Internal Medicine

## 2020-08-20 ENCOUNTER — Encounter: Payer: Self-pay | Admitting: Internal Medicine

## 2020-08-20 ENCOUNTER — Telehealth: Payer: Self-pay

## 2020-08-20 VITALS — BP 110/70 | HR 82 | Temp 98.2°F | Ht 73.0 in | Wt 197.0 lb

## 2020-08-20 DIAGNOSIS — K6289 Other specified diseases of anus and rectum: Secondary | ICD-10-CM | POA: Diagnosis not present

## 2020-08-20 DIAGNOSIS — N4281 Prostatodynia syndrome: Secondary | ICD-10-CM | POA: Diagnosis not present

## 2020-08-20 DIAGNOSIS — R7989 Other specified abnormal findings of blood chemistry: Secondary | ICD-10-CM

## 2020-08-20 LAB — POCT URINALYSIS DIPSTICK
Appearance: NEGATIVE
Bilirubin, UA: NEGATIVE
Glucose, UA: NEGATIVE
Ketones, UA: NEGATIVE
Leukocytes, UA: NEGATIVE
Nitrite, UA: NEGATIVE
Odor: NEGATIVE
Protein, UA: NEGATIVE
Spec Grav, UA: 1.01 (ref 1.010–1.025)
Urobilinogen, UA: 0.2 E.U./dL
pH, UA: 6.5 (ref 5.0–8.0)

## 2020-08-20 MED ORDER — KETOCONAZOLE 2 % EX SHAM: 1.0000 "application " | MEDICATED_SHAMPOO | CUTANEOUS | 99 refills | Status: DC

## 2020-08-20 MED ORDER — CLOTRIMAZOLE-BETAMETHASONE 1-0.05 % EX CREA: 1.0000 "application " | TOPICAL_CREAM | Freq: Two times a day (BID) | CUTANEOUS | 99 refills | Status: DC

## 2020-08-20 MED ORDER — DOXYCYCLINE HYCLATE 100 MG PO TABS: 100.0000 mg | ORAL_TABLET | Freq: Two times a day (BID) | ORAL | 0 refills | Status: DC

## 2020-08-20 NOTE — Telephone Encounter (Signed)
Coming at noon

## 2020-08-20 NOTE — Telephone Encounter (Signed)
Patient called is having "prostate" pain and burning, going for a week now. He would like to come in and be evaluated. He said he is off on Wednesdays. Please advise.

## 2020-08-21 LAB — URINE CULTURE
MICRO NUMBER:: 11021166
Result:: NO GROWTH
SPECIMEN QUALITY:: ADEQUATE

## 2020-08-21 LAB — URINALYSIS, MICROSCOPIC ONLY
Bacteria, UA: NONE SEEN /HPF
Hyaline Cast: NONE SEEN /LPF
RBC / HPF: NONE SEEN /HPF (ref 0–2)
Squamous Epithelial / LPF: NONE SEEN /HPF (ref <=5)
WBC, UA: NONE SEEN /HPF (ref 0–5)

## 2020-08-21 LAB — TESTOSTERONE: Testosterone: 213 ng/dL — ABNORMAL LOW (ref 250–827)

## 2020-08-21 LAB — PSA: PSA: 0.81 ng/mL (ref <=4.0)

## 2020-09-11 NOTE — Progress Notes (Signed)
   Subjective:    Patient ID: John Hunt, male    DOB: 01/11/1961, 59 y.o.   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 He called wondering if he had prostatitis. Office visit advised. His general health is excellent. No hematuria. No fever,chills, nausea or vomiting.    Review of Systems has noted decrease in strength of orgasm. Raised concern about testosterone level.     Objective:   Physical Exam Blood pressure is 110/70, pulse 82, temperature 98.2 degrees orally pulse oximetry 96% weight 197 pounds height 6 feet 1 inches BMI 25.99  His prostate is boggy today.  It was massaged and repeat urine dipstick was obtained not showing significant white blood cells on urine dipstick.  Initial urine dipstick showed trace occult blood.  Urine was sent for microscopic but no white cells or bacteria noted.  Urine culture was negative.  He has redness in the perirectal area consistent with Candida.  Testosterone level low at 213 normal being between 250 and 827.  PSA is normal at 0.81.       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  Have treated him with doxycycline 100 mg twice daily for 14 days.  For perirectal irritation Lotrisone cream twice daily as needed.  His PSA is normal.  Recommend he have health maintenance exam in the near future.

## 2020-09-11 NOTE — Patient Instructions (Addendum)
Take doxycycline 100 mg twice daily for 14 days.  Use Lotrisone in perirectal area twice daily as needed.  Serum testosterone level is low at 213.  Referral to [facility] for consideration of testosterone therapy.  PSA is normal.  Have health maintenance exam in the near future.

## 2020-12-24 DIAGNOSIS — Z20822 Contact with and (suspected) exposure to covid-19: Secondary | ICD-10-CM | POA: Diagnosis not present

## 2020-12-24 DIAGNOSIS — Z03818 Encounter for observation for suspected exposure to other biological agents ruled out: Secondary | ICD-10-CM | POA: Diagnosis not present

## 2021-01-12 DIAGNOSIS — E291 Testicular hypofunction: Secondary | ICD-10-CM | POA: Diagnosis not present

## 2021-04-06 DIAGNOSIS — E291 Testicular hypofunction: Secondary | ICD-10-CM | POA: Diagnosis not present

## 2021-04-13 DIAGNOSIS — E291 Testicular hypofunction: Secondary | ICD-10-CM | POA: Diagnosis not present

## 2021-09-23 ENCOUNTER — Encounter: Payer: Self-pay | Admitting: Internal Medicine

## 2021-09-28 DIAGNOSIS — E291 Testicular hypofunction: Secondary | ICD-10-CM | POA: Diagnosis not present

## 2021-09-28 DIAGNOSIS — Z125 Encounter for screening for malignant neoplasm of prostate: Secondary | ICD-10-CM | POA: Diagnosis not present

## 2021-10-05 DIAGNOSIS — R3915 Urgency of urination: Secondary | ICD-10-CM | POA: Diagnosis not present

## 2021-10-05 DIAGNOSIS — E291 Testicular hypofunction: Secondary | ICD-10-CM | POA: Diagnosis not present

## 2022-04-19 DIAGNOSIS — N401 Enlarged prostate with lower urinary tract symptoms: Secondary | ICD-10-CM | POA: Diagnosis not present

## 2022-04-19 DIAGNOSIS — R948 Abnormal results of function studies of other organs and systems: Secondary | ICD-10-CM | POA: Diagnosis not present

## 2022-04-19 DIAGNOSIS — E291 Testicular hypofunction: Secondary | ICD-10-CM | POA: Diagnosis not present

## 2022-04-19 DIAGNOSIS — R3915 Urgency of urination: Secondary | ICD-10-CM | POA: Diagnosis not present

## 2022-09-27 ENCOUNTER — Ambulatory Visit (INDEPENDENT_AMBULATORY_CARE_PROVIDER_SITE_OTHER): Payer: BC Managed Care – PPO | Admitting: Dermatology

## 2022-09-27 DIAGNOSIS — L603 Nail dystrophy: Secondary | ICD-10-CM | POA: Diagnosis not present

## 2022-09-27 DIAGNOSIS — D239 Other benign neoplasm of skin, unspecified: Secondary | ICD-10-CM

## 2022-09-27 DIAGNOSIS — D485 Neoplasm of uncertain behavior of skin: Secondary | ICD-10-CM | POA: Diagnosis not present

## 2022-09-27 DIAGNOSIS — L820 Inflamed seborrheic keratosis: Secondary | ICD-10-CM | POA: Diagnosis not present

## 2022-09-27 DIAGNOSIS — D179 Benign lipomatous neoplasm, unspecified: Secondary | ICD-10-CM

## 2022-09-27 DIAGNOSIS — L821 Other seborrheic keratosis: Secondary | ICD-10-CM

## 2022-09-27 DIAGNOSIS — L578 Other skin changes due to chronic exposure to nonionizing radiation: Secondary | ICD-10-CM

## 2022-09-27 DIAGNOSIS — D229 Melanocytic nevi, unspecified: Secondary | ICD-10-CM

## 2022-09-27 DIAGNOSIS — Z1283 Encounter for screening for malignant neoplasm of skin: Secondary | ICD-10-CM

## 2022-09-27 DIAGNOSIS — L814 Other melanin hyperpigmentation: Secondary | ICD-10-CM

## 2022-09-27 DIAGNOSIS — L408 Other psoriasis: Secondary | ICD-10-CM

## 2022-09-27 DIAGNOSIS — D1801 Hemangioma of skin and subcutaneous tissue: Secondary | ICD-10-CM | POA: Diagnosis not present

## 2022-09-27 DIAGNOSIS — D2310 Other benign neoplasm of skin of unspecified eyelid, including canthus: Secondary | ICD-10-CM | POA: Diagnosis not present

## 2022-09-27 MED ORDER — KETOCONAZOLE 2 % EX CREA: 1.0000 | TOPICAL_CREAM | Freq: Every day | CUTANEOUS | 11 refills | Status: DC

## 2022-09-27 MED ORDER — ZORYVE 0.3 % EX CREA: 1.0000 "application " | TOPICAL_CREAM | Freq: Every day | CUTANEOUS | 11 refills | Status: DC

## 2022-09-27 MED ORDER — HYDROCORTISONE 2.5 % EX CREA: TOPICAL_CREAM | Freq: Every day | CUTANEOUS | 11 refills | Status: DC

## 2022-09-27 NOTE — Patient Instructions (Addendum)
Psoriasis is a chronic non-curable, but treatable genetic/hereditary disease that may have other systemic features affecting other organ systems such as joints (Psoriatic Arthritis). It is associated with an increased risk of inflammatory bowel disease, heart disease, non-alcoholic fatty liver disease, and depression.      Wound Care Instructions  Cleanse wound gently with soap and water once a day then pat dry with clean gauze. Apply a thin coat of Petrolatum (petroleum jelly, "Vaseline") over the wound (unless you have an allergy to this). We recommend that you use a new, sterile tube of Vaseline. Do not pick or remove scabs. Do not remove the yellow or white "healing tissue" from the base of the wound.  Cover the wound with fresh, clean, nonstick gauze and secure with paper tape. You may use Band-Aids in place of gauze and tape if the wound is small enough, but would recommend trimming much of the tape off as there is often too much. Sometimes Band-Aids can irritate the skin.  You should call the office for your biopsy report after 1 week if you have not already been contacted.  If you experience any problems, such as abnormal amounts of bleeding, swelling, significant bruising, significant pain, or evidence of infection, please call the office immediately.  FOR ADULT SURGERY PATIENTS: If you need something for pain relief you may take 1 extra strength Tylenol (acetaminophen) AND 2 Ibuprofen ('200mg'$  each) together every 4 hours as needed for pain. (do not take these if you are allergic to them or if you have a reason you should not take them.) Typically, you may only need pain medication for 1 to 3 days.     Cryotherapy Aftercare  Wash gently with soap and water everyday.   Apply Vaseline and Band-Aid daily until healed.    Due to recent changes in healthcare laws, you may see results of your pathology and/or laboratory studies on MyChart before the doctors have had a chance to review them.  We understand that in some cases there may be results that are confusing or concerning to you. Please understand that not all results are received at the same time and often the doctors may need to interpret multiple results in order to provide you with the best plan of care or course of treatment. Therefore, we ask that you please give [place] 2 business days to thoroughly review all your results before contacting the office for clarification. Should we see a critical lab result, you will be contacted sooner.   If You Need Anything After Your Visit  If you have any questions or concerns for your doctor, please call our main line at [phone] and press option 4 to reach your doctor's medical assistant. If no one answers, please leave a voicemail as directed and we will return your call as soon as possible. Messages left after 4 pm will be answered the following business day.   You may also send [place] a message via [place]. We typically respond to MyChart messages within 1-2 business days.  For prescription refills, please ask your pharmacy to contact our office. Our fax number is [phone].  If you have an urgent issue when the clinic is closed that cannot wait until the next business day, you can page your doctor at the number below.    Please note that while we do our best to be available for urgent issues outside of office hours, we are not available 24/7.   If you have an urgent issue and are unable  to reach [place], you may choose to seek medical care at your doctor's office, retail clinic, urgent care center, or emergency room.  If you have a medical emergency, please immediately call 911 or go to the emergency department.  Pager Numbers  - Dr. Nehemiah Massed: [phone]  - Dr. Laurence Ferrari: [phone]  - Dr. Nicole Kindred: [phone]  In the event of inclement weather, please call our main line at [phone] for an update on the status of any delays or closures.  Dermatology Medication Tips: Please  keep the boxes that topical medications come in in order to help keep track of the instructions about where and how to use these. Pharmacies typically print the medication instructions only on the boxes and not directly on the medication tubes.   If your medication is too expensive, please contact our office at [phone] option 4 or send [place] a message through [place].   We are unable to tell what your co-pay for medications will be in advance as this is different depending on your insurance coverage. However, we may be able to find a substitute medication at lower cost or fill out paperwork to get insurance to cover a needed medication.   If a prior authorization is required to get your medication covered by your insurance company, please allow [place] 1-2 business days to complete this process.  Drug prices often vary depending on where the prescription is filled and some pharmacies may offer cheaper prices.  The website www.goodrx.com contains coupons for medications through different pharmacies. The prices here do not account for what the cost may be with help from insurance (it may be cheaper with your insurance), but the website can give you the price if you did not use any insurance.  - You can print the associated coupon and take it with your prescription to the pharmacy.  - You may also stop by our office during regular business hours and pick up a GoodRx coupon card.  - If you need your prescription sent electronically to a different pharmacy, notify our office through [facility] or by phone at [phone] option 4.     Si Usted Necesita Algo Despus de Su Visita  Tambin puede enviarnos un mensaje a travs de Pharmacist, community. Por lo general respondemos a los mensajes de MyChart en el transcurso de 1 a 2 das hbiles.  Para renovar recetas, por favor pida a su farmacia que se ponga en contacto con nuestra oficina. Harland Dingwall de fax es [place] [phone].  Si tiene un asunto urgente  cuando la clnica est cerrada y que no puede esperar hasta el siguiente da hbil, puede llamar/localizar a su doctor(a) al nmero que aparece a continuacin.   Por favor, tenga en cuenta que aunque hacemos todo lo posible para estar disponibles para asuntos urgentes fuera del horario de [place], no estamos disponibles las 24 horas del da, los 7 das de la [place].   Si tiene un problema urgente y no puede comunicarse con nosotros, puede optar por buscar atencin mdica  en el consultorio de su doctor(a), en una clnica privada, en un centro de atencin urgente o en una sala de emergencias.  Si tiene Engineering geologist, por favor llame inmediatamente al 911 o vaya a la sala de emergencias.  Nmeros de bper  - Dr. Nehemiah Massed: [phone]  - Dra. Moye: [phone]  - Dra. Nicole Kindred: [phone]  En caso de inclemencias del [place], por favor llame a nuestra lnea principal al [phone] para una actualizacin sobre el [place] de  cualquier retraso o cierre.  Consejos para la medicacin en dermatologa: Por favor, guarde las cajas en las que vienen los medicamentos de uso tpico para ayudarle a seguir las instrucciones sobre dnde y cmo usarlos. Las farmacias generalmente imprimen las instrucciones del medicamento slo en las cajas y no directamente en los tubos del [place].   Si su medicamento es muy caro, por favor, pngase en contacto con Zigmund Daniel llamando al [phone] y presione la opcin 4 o envenos un mensaje a travs de Pharmacist, community.   No podemos decirle cul ser su copago por los medicamentos por adelantado ya que esto es diferente dependiendo de la cobertura de su seguro. Sin embargo, es posible que podamos encontrar un medicamento sustituto a Electrical engineer un formulario para que el seguro cubra el medicamento que se considera necesario.   Si se requiere una autorizacin previa para que su compaa de seguros [place] su medicamento, por favor permtanos de 1 a 2  das hbiles para completar este proceso.  Los precios de los medicamentos varan con frecuencia dependiendo del Environmental consultant de dnde se surte la receta y alguna farmacias pueden ofrecer precios ms baratos.  El sitio web www.goodrx.com tiene cupones para medicamentos de Airline pilot. Los precios aqu no tienen en cuenta lo que podra costar con la ayuda del seguro (puede ser ms barato con su seguro), pero el sitio web puede darle el precio si no utiliz Research scientist (physical sciences).  - Puede imprimir el cupn correspondiente y llevarlo con su receta a la farmacia.  - Tambin puede pasar por nuestra oficina durante el horario de atencin regular y Charity fundraiser una tarjeta de cupones de GoodRx.  - Si necesita que su receta se enve electrnicamente a una farmacia diferente, informe a nuestra oficina a travs de MyChart de [place] o por telfono llamando al [phone] y presione la opcin 4.

## 2022-09-27 NOTE — Progress Notes (Signed)
New Patient Visit  Subjective  John Hunt is a 61 y.o. male who presents for the following: Other (New patient - No history of skin cancer. The patient presents for Total-Body Skin Exam (TBSE) for skin cancer screening and mole check.  The patient has spots, moles and lesions to be evaluated, some may be new or changing and the patient has concerns that these could be cancer./).  The following portions of the chart were reviewed this encounter and updated as appropriate:   Tobacco  Allergies  Meds  Problems  Med Hx  Surg Hx  Fam Hx     Review of Systems:  No other skin or systemic complaints except as noted in HPI or Assessment and Plan.  Objective  Well appearing patient in no apparent distress; mood and affect are within normal limits.  A full examination was performed including scalp, head, eyes, ears, nose, lips, neck, chest, axillae, abdomen, back, buttocks, bilateral upper extremities, bilateral lower extremities, hands, feet, fingers, toes, fingernails, and toenails. All findings within normal limits unless otherwise noted below.  Left Upper Back Paraspinal 0.4 cm pink papule  Right hip Linear fleshy papule  Left 4th Finger Nail Plate Nail dystrophy  Left Upper Eyelid Flesh colored papules  Anterior scalp and forehead Pinkness  Right cheek x 1, right ear x 1, left sup sternal x 1 (3) Erythematous stuck-on, waxy papule or plaque  Gluteal Crease Pinkness and scale   Assessment & Plan  Neoplasm of uncertain behavior of skin Left Upper Back Paraspinal Epidermal / dermal shaving  Lesion diameter (cm):  0.4 Informed consent: discussed and consent obtained   Timeout: patient name, date of birth, surgical site, and procedure verified   Procedure prep:  Patient was prepped and draped in usual sterile fashion Prep type:  Isopropyl alcohol Anesthesia: the lesion was anesthetized in a standard fashion   Anesthetic:  1% lidocaine w/ epinephrine 1-100,000  buffered w/ 8.4% NaHCO3 Instrument used: flexible razor blade   Hemostasis achieved with: pressure, aluminum chloride and electrodesiccation   Outcome: patient tolerated procedure well   Post-procedure details: sterile dressing applied and wound care instructions given   Dressing type: bandage and petrolatum    Specimen 1 - Surgical pathology Differential Diagnosis: Angiofibroma R/O dysplasia Check Margins: No  Fibrolipoma Right hip Benign-appearing.  Observation.  Call clinic for new or changing lesions.  Recommend daily use of broad spectrum spf 30+ sunscreen to sun-exposed areas.   Nail dystrophy Left 4th Finger Nail Plate Patient states it has improved with Clotrimazole. Discussed prescription nail laquers. He declines and will continue current treatment.  Syringomas  Eyelids Benign-appearing.  Observation.  Call clinic for new or changing lesions.  Recommend daily use of broad spectrum spf 30+ sunscreen to sun-exposed areas.   Sebopsoriasis Anterior scalp and forehead Start Ketoconazole 2% cream qhs Monday, Wednesday, Friday and  Hydrocortisone 2.5% cream qhs Tuesday, Thursday, Saturday  ketoconazole (NIZORAL) 2 % cream - Anterior scalp and forehead Apply 1 Application topically daily. Monday, Wednesday, Friday to forehead/scalp  hydrocortisone 2.5 % cream - Anterior scalp and forehead Apply topically daily. Tuesday, Thursday, Saturday to forehead/scalp  Inflamed seborrheic keratosis (3) Right cheek x 1, right ear x 1, left sup sternal x 1 Symptomatic, irritating, patient would like treated. Destruction of lesion - Right cheek x 1, right ear x 1, left sup sternal x 1 Complexity: simple   Destruction method: cryotherapy   Informed consent: discussed and consent obtained   Timeout:  patient name,  date of birth, surgical site, and procedure verified Lesion destroyed using liquid nitrogen: Yes   Region frozen until ice ball extended beyond lesion: Yes   Outcome: patient  tolerated procedure well with no complications   Post-procedure details: wound care instructions given    Inverse psoriasis Gluteal Crease Psoriasis is a chronic non-curable, but treatable genetic/hereditary disease that may have other systemic features affecting other organ systems such as joints (Psoriatic Arthritis). It is associated with an increased risk of inflammatory bowel disease, heart disease, non-alcoholic fatty liver disease, and depression.    Start Zoryve cream qd - will plan [place] if not covered.  Roflumilast (ZORYVE) 0.3 % CREA - Gluteal Crease Apply 1 application  topically daily.  Lentigines - Scattered tan macules - Due to sun exposure - Benign-appearing, observe - Recommend daily broad spectrum sunscreen SPF 30+ to sun-exposed areas, reapply every 2 hours as needed. - Call for any changes  Seborrheic Keratoses - Stuck-on, waxy, tan-brown papules and/or plaques  - Benign-appearing - Discussed benign etiology and prognosis. - Observe - Call for any changes  Melanocytic Nevi - Tan-brown and/or pink-flesh-colored symmetric macules and papules - Benign appearing on exam today - Observation - Call clinic for new or changing moles - Recommend daily use of broad spectrum spf 30+ sunscreen to sun-exposed areas.   Hemangiomas - Red papules - Discussed benign nature - Observe - Call for any changes  Actinic Damage - Chronic condition, secondary to cumulative UV/sun exposure - diffuse scaly erythematous macules with underlying dyspigmentation - Recommend daily broad spectrum sunscreen SPF 30+ to sun-exposed areas, reapply every 2 hours as needed.  - Staying in the shade or wearing long sleeves, sun glasses (UVA+UVB protection) and wide brim hats (4-inch brim around the entire circumference of the hat) are also recommended for sun protection.  - Call for new or changing lesions.  Skin cancer screening performed today.  Return in about 3 months (around 12/28/2022)  for Psoriasis.  I, Ashok Cordia, CMA, am acting as scribe for Sarina Ser, MD . Documentation: I have reviewed the above documentation for accuracy and completeness, and I agree with the above.  Sarina Ser, MD

## 2022-10-03 ENCOUNTER — Telehealth: Payer: Self-pay

## 2022-10-03 NOTE — Telephone Encounter (Signed)
Tried calling patient to advise of bx results and his mailbox is full./sh

## 2022-10-03 NOTE — Telephone Encounter (Signed)
-----   Message from Ralene Bathe, MD sent at 10/02/2022  6:14 PM EST ----- Diagnosis Skin , left upper back paraspinal HEMANGIOMA   Benign hemangioma = collection of blood vessels No further treatment needed

## 2022-10-04 ENCOUNTER — Telehealth: Payer: Self-pay

## 2022-10-04 NOTE — Telephone Encounter (Addendum)
Patient reviewed results through [facility] and denied questions.   ----- Message from Ralene Bathe, MD sent at 10/02/2022  6:14 PM EST ----- Diagnosis Skin , left upper back paraspinal HEMANGIOMA   Benign hemangioma = collection of blood vessels No further treatment needed

## 2022-10-05 ENCOUNTER — Telehealth: Payer: Self-pay

## 2022-10-05 DIAGNOSIS — L408 Other psoriasis: Secondary | ICD-10-CM

## 2022-10-05 NOTE — Telephone Encounter (Signed)
Tried calling patient but no answer and VM full. aw

## 2022-10-05 NOTE — Telephone Encounter (Signed)
John Hunt will cost patient over $800. He currently has a high deductible and has to pay out of pocket for most medications. He states a cheaper alternative was discussed in the room but not noted?

## 2022-10-06 ENCOUNTER — Encounter: Payer: Self-pay | Admitting: Dermatology

## 2022-10-11 MED ORDER — ZORYVE 0.3 % EX CREA: 1.0000 "application " | TOPICAL_CREAM | Freq: Every day | CUTANEOUS | 11 refills | Status: DC

## 2022-10-11 NOTE — Addendum Note (Signed)
Addended by: Johnsie Kindred R on: 10/11/2022 02:14 PM   Modules accepted: Orders

## 2023-01-03 ENCOUNTER — Ambulatory Visit: Payer: BC Managed Care – PPO | Admitting: Dermatology

## 2023-01-17 ENCOUNTER — Ambulatory Visit: Payer: 59 | Admitting: Dermatology

## 2023-01-17 VITALS — BP 147/93 | HR 66

## 2023-01-17 DIAGNOSIS — L408 Other psoriasis: Secondary | ICD-10-CM

## 2023-01-17 DIAGNOSIS — Z79899 Other long term (current) drug therapy: Secondary | ICD-10-CM

## 2023-01-17 NOTE — Patient Instructions (Addendum)
For forehead Sebopsoriasis May add Zoryve cream once daily in conjunction with Ketoconazole 2% cr 3 days a week, and Hydrocortisone 2.5% cream 3 days a week  For buttocks crease, Inverse Psoriasis Cont Zoryve cream nightly as needed for flares, may stop when clear and restart when flares  Due to recent changes in healthcare laws, you may see results of your pathology and/or laboratory studies on MyChart before the doctors have had a chance to review them. We understand that in some cases there may be results that are confusing or concerning to you. Please understand that not all results are received at the same time and often the doctors may need to interpret multiple results in order to provide you with the best plan of care or course of treatment. Therefore, we ask that you please give [place] 2 business days to thoroughly review all your results before contacting the office for clarification. Should we see a critical lab result, you will be contacted sooner.   If You Need Anything After Your Visit  If you have any questions or concerns for your doctor, please call our main line at [phone] and press option 4 to reach your doctor's medical assistant. If no one answers, please leave a voicemail as directed and we will return your call as soon as possible. Messages left after 4 pm will be answered the following business day.   You may also send [place] a message via [place]. We typically respond to MyChart messages within 1-2 business days.  For prescription refills, please ask your pharmacy to contact our office. Our fax number is [phone].  If you have an urgent issue when the clinic is closed that cannot wait until the next business day, you can page your doctor at the number below.    Please note that while we do our best to be available for urgent issues outside of office hours, we are not available 24/7.   If you have an urgent issue and are unable to reach [place], you may choose to seek medical  care at your doctor's office, retail clinic, urgent care center, or emergency room.  If you have a medical emergency, please immediately call 911 or go to the emergency department.  Pager Numbers  - Dr. Nehemiah Massed: [phone]  - Dr. Laurence Ferrari: [phone]  - Dr. Nicole Kindred: [phone]  In the event of inclement weather, please call our main line at [phone] for an update on the status of any delays or closures.  Dermatology Medication Tips: Please keep the boxes that topical medications come in in order to help keep track of the instructions about where and how to use these. Pharmacies typically print the medication instructions only on the boxes and not directly on the medication tubes.   If your medication is too expensive, please contact our office at [phone] option 4 or send [place] a message through [place].   We are unable to tell what your co-pay for medications will be in advance as this is different depending on your insurance coverage. However, we may be able to find a substitute medication at lower cost or fill out paperwork to get insurance to cover a needed medication.   If a prior authorization is required to get your medication covered by your insurance company, please allow [place] 1-2 business days to complete this process.  Drug prices often vary depending on where the prescription is filled and some pharmacies may offer cheaper prices.  The website www.goodrx.com contains coupons for medications through different pharmacies.  The prices here do not account for what the cost may be with help from insurance (it may be cheaper with your insurance), but the website can give you the price if you did not use any insurance.  - You can print the associated coupon and take it with your prescription to the pharmacy.  - You may also stop by our office during regular business hours and pick up a GoodRx coupon card.  - If you need your prescription sent electronically to a different  pharmacy, notify our office through [facility] or by phone at [phone] option 4.     Si Usted Necesita Algo Despus de Su Visita  Tambin puede enviarnos un mensaje a travs de Pharmacist, community. Por lo general respondemos a los mensajes de MyChart en el transcurso de 1 a 2 das hbiles.  Para renovar recetas, por favor pida a su farmacia que se ponga en contacto con nuestra oficina. Harland Dingwall de fax es [place] [phone].  Si tiene un asunto urgente cuando la clnica est cerrada y que no puede esperar hasta el siguiente da hbil, puede llamar/localizar a su doctor(a) al nmero que aparece a continuacin.   Por favor, tenga en cuenta que aunque hacemos todo lo posible para estar disponibles para asuntos urgentes fuera del horario de [place], no estamos disponibles las 24 horas del da, los 7 das de la [place].   Si tiene un problema urgente y no puede comunicarse con nosotros, puede optar por buscar atencin mdica  en el consultorio de su doctor(a), en una clnica privada, en un centro de atencin urgente o en una sala de emergencias.  Si tiene Engineering geologist, por favor llame inmediatamente al 911 o vaya a la sala de emergencias.  Nmeros de bper  - Dr. Nehemiah Massed: [phone]  - Dra. Moye: [phone]  - Dra. Nicole Kindred: [phone]  En caso de inclemencias del [place], por favor llame a Johnsie Kindred principal al [phone] para una actualizacin sobre el [place] de cualquier retraso o cierre.  Consejos para la medicacin en dermatologa: Por favor, guarde las cajas en las que vienen los medicamentos de uso tpico para ayudarle a seguir las instrucciones sobre dnde y cmo usarlos. Las farmacias generalmente imprimen las instrucciones del medicamento slo en las cajas y no directamente en los tubos del [place].   Si su medicamento es muy caro, por favor, pngase en contacto con Zigmund Daniel llamando al [phone] y presione la opcin 4 o envenos un mensaje a  travs de Pharmacist, community.   No podemos decirle cul ser su copago por los medicamentos por adelantado ya que esto es diferente dependiendo de la cobertura de su seguro. Sin embargo, es posible que podamos encontrar un medicamento sustituto a Electrical engineer un formulario para que el seguro cubra el medicamento que se considera necesario.   Si se requiere una autorizacin previa para que su compaa de seguros [place] su medicamento, por favor permtanos de 1 a 2 das hbiles para completar este proceso.  Los precios de los medicamentos varan con frecuencia dependiendo del Environmental consultant de dnde se surte la receta y alguna farmacias pueden ofrecer precios ms baratos.  El sitio web www.goodrx.com tiene cupones para medicamentos de Airline pilot. Los precios aqu no tienen en cuenta lo que podra costar con la ayuda del seguro (puede ser ms barato con su seguro), pero el sitio web puede darle el precio si no utiliz Research scientist (physical sciences).  - Puede imprimir el cupn correspondiente y llevarlo con su receta  a la farmacia.  - Tambin puede pasar por nuestra oficina durante el horario de atencin regular y Charity fundraiser una tarjeta de cupones de GoodRx.  - Si necesita que su receta se enve electrnicamente a una farmacia diferente, informe a nuestra oficina a travs de MyChart de [place] o por telfono llamando al [phone] y presione la opcin 4.

## 2023-01-17 NOTE — Progress Notes (Signed)
   Follow-Up Visit   Subjective  John Hunt is a 62 y.o. male who presents for the following: Sebopsoriasis (Scalp, face, Ketoconazole 2% cr 3d/wk, HC 2.5% cr 3d/wk) and Inverse psoriasis (Gluteal crease, Zoryve cr qhs, not as itchy).  The following portions of the chart were reviewed this encounter and updated as appropriate:   Tobacco  Allergies  Meds  Problems  Med Hx  Surg Hx  Fam Hx     Review of Systems:  No other skin or systemic complaints except as noted in HPI or Assessment and Plan.  Objective  Well appearing patient in no apparent distress; mood and affect are within normal limits.  A focused examination was performed including face, scalp, buttocks. Relevant physical exam findings are noted in the Assessment and Plan.  scalp, face Anterior scalp/forehead clear  Gluteal Crease Some hyperpigmentation, otherwise clear   Assessment & Plan  Sebopsoriasis scalp, face Counseling on psoriasis and coordination of care  psoriasis is a chronic non-curable, but treatable genetic/hereditary disease that may have other systemic features affecting other organ systems such as joints (Psoriatic Arthritis). It is associated with an increased risk of inflammatory bowel disease, heart disease, non-alcoholic fatty liver disease, and depression.  Treatments include light and laser treatments; topical medications; and systemic medications including oral and injectables.   Cont Ketoconazole 2% cr 3d/wk Monday, Wednesday, Friday Cont HC 2.5% cr 3d/wk, Tuesday, Thursday, Saturday Can add in [place] cr qd if not improving from Ketoconazole 2% cr and HC 2.5% cr  Related Medications ketoconazole (NIZORAL) 2 % cream Apply 1 Application topically daily. Monday, Wednesday, Friday to forehead/scalp  hydrocortisone 2.5 % cream Apply topically daily. Tuesday, Thursday, Saturday to forehead/scalp  Inverse psoriasis Gluteal Crease Counseling on psoriasis and coordination of care   psoriasis is a chronic non-curable, but treatable genetic/hereditary disease that may have other systemic features affecting other organ systems such as joints (Psoriatic Arthritis). It is associated with an increased risk of inflammatory bowel disease, heart disease, non-alcoholic fatty liver disease, and depression.  Treatments include light and laser treatments; topical medications; and systemic medications including oral and injectables.   Zoryve cream qd prn aa   Related Medications Roflumilast (ZORYVE) 0.3 % CREA Apply 1 application  topically daily.  Return in about 1 year (around 01/18/2024) for TBSE, psoriasis f/u.  I, Othelia Pulling, RMA, am acting as scribe for Sarina Ser, MD . Documentation: I have reviewed the above documentation for accuracy and completeness, and I agree with the above.  Sarina Ser, MD

## 2023-01-24 ENCOUNTER — Encounter: Payer: Self-pay | Admitting: Dermatology

## 2023-03-23 ENCOUNTER — Encounter: Payer: Self-pay | Admitting: Internal Medicine

## 2023-04-02 ENCOUNTER — Telehealth: Payer: Self-pay

## 2023-04-02 NOTE — Telephone Encounter (Signed)
Patient's Zoryve PA denied.  Patient has been getting medication from [facility]. Left message for patient to return my call to see if he could like to continue getting from pharmacy or change to alternative medication.

## 2023-04-06 ENCOUNTER — Ambulatory Visit (AMBULATORY_SURGERY_CENTER): Payer: 59

## 2023-04-06 VITALS — Ht 73.0 in | Wt 190.0 lb

## 2023-04-06 DIAGNOSIS — Z85038 Personal history of other malignant neoplasm of large intestine: Secondary | ICD-10-CM

## 2023-04-06 NOTE — Progress Notes (Signed)

## 2023-04-09 ENCOUNTER — Other Ambulatory Visit: Payer: Self-pay

## 2023-04-09 ENCOUNTER — Telehealth: Payer: Self-pay

## 2023-04-09 ENCOUNTER — Encounter: Payer: Self-pay | Admitting: Internal Medicine

## 2023-04-09 DIAGNOSIS — L408 Other psoriasis: Secondary | ICD-10-CM

## 2023-04-09 MED ORDER — ZORYVE 0.3 % EX CREA: 1.0000 "application " | TOPICAL_CREAM | Freq: Every day | CUTANEOUS | 6 refills | Status: DC

## 2023-04-09 NOTE — Telephone Encounter (Signed)
Called ot discussed PA denied for [facility], patient would like to continue paying $50 out of pocket for this rx,   Okay erx'd Zoryve to [facility] pharmacy

## 2023-04-09 NOTE — Telephone Encounter (Signed)
Called [facility] pharmacy to let them know, PA for Zoryve was denied, pt would like to continue paying $50 with a coupon, per [facility] pharmacy pt can not use this coupon with a denied PA

## 2023-04-10 MED ORDER — ZORYVE 0.3 % EX CREA: 1.0000 "application " | TOPICAL_CREAM | Freq: Every day | CUTANEOUS | 6 refills | Status: DC

## 2023-04-10 NOTE — Addendum Note (Signed)
Addended by: Dorathy Daft R on: 04/10/2023 09:03 AM   Modules accepted: Orders

## 2023-04-10 NOTE — Telephone Encounter (Signed)
Switching to [facility] pharmacy to see what we can get non covered price at

## 2023-04-11 NOTE — Telephone Encounter (Signed)
Appeal sent to [facility]. Patient has done one requirement from PA denial . aw

## 2023-04-23 ENCOUNTER — Telehealth: Payer: Self-pay | Admitting: Internal Medicine

## 2023-04-23 NOTE — Telephone Encounter (Signed)
Returned the patient's phone call. He is "feeling much better today with just  a slight intermittent cough." Told his as long as he felt the same he should be able to proceed with the procedure tomorrow.

## 2023-04-23 NOTE — Telephone Encounter (Signed)
Patient called stating he had a cold over the weekend. He tested negative for COVID and did not have a fever. He is still a little bit congested. He would like a call back to make sure his colonoscopy scheduled for tomorrow 6/4 is still okay to proceed with. Okay to leave a detailed voicemail. Please advise, thank you.

## 2023-04-24 ENCOUNTER — Encounter: Payer: Self-pay | Admitting: Internal Medicine

## 2023-04-24 ENCOUNTER — Ambulatory Visit (AMBULATORY_SURGERY_CENTER): Payer: 59 | Admitting: Internal Medicine

## 2023-04-24 VITALS — BP 112/76 | HR 85 | Temp 98.6°F | Resp 15 | Ht 73.0 in | Wt 190.0 lb

## 2023-04-24 DIAGNOSIS — Z85038 Personal history of other malignant neoplasm of large intestine: Secondary | ICD-10-CM

## 2023-04-24 DIAGNOSIS — Z08 Encounter for follow-up examination after completed treatment for malignant neoplasm: Secondary | ICD-10-CM

## 2023-04-24 MED ORDER — SODIUM CHLORIDE 0.9 % IV SOLN: 500.0000 mL | Freq: Once | INTRAVENOUS | Status: DC

## 2023-04-24 NOTE — Patient Instructions (Addendum)
No polyps or cancer were seen today!  You still have diverticulosis - thickened muscle rings and pouches in the colon wall. Please read the handout about this condition.  Also saw swollen hemorrhoids - common after a prep.  Your next routine colonoscopy should be in 5 years - 2029.  I appreciate the opportunity to care for you. Iva Boop, MD, [facility]  Thank you for letting us take care of your healthcare needs today.   Please see handouts given to you on Diverticulosis and Hemorrhoids.  YOU HAD AN ENDOSCOPIC PROCEDURE TODAY AT THE  ENDOSCOPY CENTER:   Refer to the procedure report that was given to you for any specific questions about what was found during the examination.  If the procedure report does not answer your questions, please call your gastroenterologist to clarify.  If you requested that your care partner not be given the details of your procedure findings, then the procedure report has been included in a sealed envelope for you to review at your convenience later.  YOU SHOULD EXPECT: Some feelings of bloating in the abdomen. Passage of more gas than usual.  Walking can help get rid of the air that was put into your GI tract during the procedure and reduce the bloating. If you had a lower endoscopy (such as a colonoscopy or flexible sigmoidoscopy) you may notice spotting of blood in your stool or on the toilet paper. If you underwent a bowel prep for your procedure, you may not have a normal bowel movement for a few days.  Please Note:  You might notice some irritation and congestion in your nose or some drainage.  This is from the oxygen used during your procedure.  There is no need for concern and it should clear up in a day or so.  SYMPTOMS TO REPORT IMMEDIATELY:  Following lower endoscopy (colonoscopy or flexible sigmoidoscopy):  Excessive amounts of blood in the stool  Significant tenderness or worsening of abdominal pains  Swelling of the abdomen that is new,  acute  Fever of 100F or higher   For urgent or emergent issues, a gastroenterologist can be reached at any hour by calling [phone]. Do not use MyChart messaging for urgent concerns.    DIET:  We do recommend a small meal at first, but then you may proceed to your regular diet.  Drink plenty of fluids but you should avoid alcoholic beverages for 24 hours.  ACTIVITY:  You should plan to take it easy for the rest of today and you should NOT DRIVE or use heavy machinery until tomorrow (because of the sedation medicines used during the test).    FOLLOW UP: Our staff will call the number listed on your records the next business day following your procedure.  We will call around 7:15- 8:00 am to check on you and address any questions or concerns that you may have regarding the information given to you following your procedure. If we do not reach you, we will leave a message.     If any biopsies were taken you will be contacted by phone or by letter within the next 1-3 weeks.  Please call us at [phone] if you have not heard about the biopsies in 3 weeks.    SIGNATURES/CONFIDENTIALITY: You and/or your care partner have signed paperwork which will be entered into your electronic medical record.  These signatures attest to the fact that that the information above on your After Visit Summary has been reviewed and is  understood.  Full responsibility of the confidentiality of this discharge information lies with you and/or your care-partner.

## 2023-04-24 NOTE — Op Note (Signed)
[facility] Patient Name: John Hunt Procedure Date: 04/24/2023 2:08 PM MRN: [id number] Endoscopist: Iva Boop , MD, [id number] Age: 62 Referring MD:  Date of Birth: 1960-11-26 Gender: Male Account #: [id number] Procedure:                Colonoscopy Indications:              High risk colon cancer surveillance: Personal                            history of colon cancer, Last colonoscopy: 2017 Medicines:                Monitored Anesthesia Care Procedure:                Pre-Anesthesia Assessment:                           - Prior to the procedure, a History and Physical                            was performed, and patient medications and                            allergies were reviewed. The patient's tolerance of                            previous anesthesia was also reviewed. The risks                            and benefits of the procedure and the sedation                            options and risks were discussed with the patient.                            All questions were answered, and informed consent                            was obtained. Prior Anticoagulants: The patient has                            taken no anticoagulant or antiplatelet agents. ASA                            Grade Assessment: II - A patient with mild systemic                            disease. After reviewing the risks and benefits,                            the patient was deemed in satisfactory condition to                            undergo the procedure.  After obtaining informed consent, the colonoscope                            was passed under direct vision. Throughout the                            procedure, the patient's blood pressure, pulse, and                            oxygen saturations were monitored continuously. The                            CF [id number] #[id number] was introduced through the anus                            and advanced to  the the cecum, identified by                            appendiceal orifice and ileocecal valve. The                            colonoscopy was performed without difficulty. The                            patient tolerated the procedure well. The quality                            of the bowel preparation was good. The ileocecal                            valve, appendiceal orifice, and rectum were                            photographed. Scope In: 2:36:25 PM Scope Out: 2:46:41 PM Scope Withdrawal Time: 0 hours 7 minutes 21 seconds  Total Procedure Duration: 0 hours 10 minutes 16 seconds  Findings:                 The perianal and digital rectal examinations were                            normal. Pertinent negatives include normal prostate                            (size, shape, and consistency).                           Many diverticula were found in the sigmoid colon.                           External and internal hemorrhoids were found.                           The exam was otherwise without abnormality on  direct and retroflexion views. Complications:            No immediate complications. Estimated Blood Loss:     Estimated blood loss was minimal. Impression:               - Diverticulosis in the sigmoid colon.                           - External and internal hemorrhoids.                           - The examination was otherwise normal on direct                            and retroflexion views.                           - No specimens collected. Recommendation:           - Patient has a contact number available for                            emergencies. The signs and symptoms of potential                            delayed complications were discussed with the                            patient. Return to normal activities tomorrow.                            Written discharge instructions were provided to the                            patient.                            - Resume previous diet.                           - Continue present medications.                           - Repeat colonoscopy in 5 years for surveillance. Iva Boop, MD 04/24/2023 2:56:24 PM This report has been signed electronically.

## 2023-04-24 NOTE — Progress Notes (Signed)
Uneventful anesthetic. Report to pacu rn. Vss. Care resumed by rn. 

## 2023-04-24 NOTE — Progress Notes (Signed)
[facility] History and Physical   Primary Care Physician:  Margaree Mackintosh, MD   Reason for Procedure:   Personal hx CRCA  Plan:    colonoscopy     HPI: John Hunt is a 62 y.o. 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 Authorizing Provider  clomiPHENE (CLOMID) 50 MG tablet  12/18/21  Yes [provider]  Roflumilast (ZORYVE) 0.3 % CREA Apply 1 application  topically daily. 04/10/23  Yes Deirdre Evener, MD  clotrimazole-betamethasone (LOTRISONE) cream Apply 1 application topically 2 (two) times daily. IN RECTAL AREA 08/20/20   Margaree Mackintosh, MD  doxycycline (VIBRA-TABS) 100 MG tablet Take 1 tablet (100 mg total) by mouth 2 (two) times daily. 08/20/20   Margaree Mackintosh, MD  hydrocortisone 2.5 % cream Apply topically daily. Tuesday, Thursday, Saturday to forehead/scalp Patient not taking: Reported on 04/06/2023 09/27/22   Deirdre Evener, MD  ketoconazole (NIZORAL) 2 % cream Apply 1 Application topically daily. Monday, Wednesday, Friday to forehead/scalp Patient not taking: Reported on 04/06/2023 09/27/22 09/16/24  Deirdre Evener, MD  ketoconazole (NIZORAL) 2 % shampoo Apply 1 application topically 2 (two) times a week. 08/23/20   Margaree Mackintosh, MD    Current Outpatient Medications  Medication Sig Dispense Refill   clomiPHENE (CLOMID) 50 MG tablet      Roflumilast (ZORYVE) 0.3 % CREA Apply 1 application  topically daily. 60 g 6    clotrimazole-betamethasone (LOTRISONE) cream Apply 1 application topically 2 (two) times daily. IN RECTAL AREA 30 g PRN   doxycycline (VIBRA-TABS) 100 MG tablet Take 1 tablet (100 mg total) by mouth 2 (two) times daily. 28 tablet 0   hydrocortisone 2.5 % cream Apply topically daily. Tuesday, Thursday, Saturday to forehead/scalp (Patient not taking: Reported on 04/06/2023) 30 g 11   ketoconazole (NIZORAL) 2 % cream Apply 1 Application topically daily. Monday, Wednesday, Friday to forehead/scalp (Patient not taking: Reported on 04/06/2023) 60 g 11   ketoconazole (NIZORAL) 2 % shampoo Apply 1 application topically 2 (two) times a week. 120 mL PRN   Current Facility-Administered Medications  Medication Dose Route Frequency Provider Last Rate Last Admin   0.9 %  sodium chloride infusion  500 mL Intravenous Once Iva Boop, MD        Allergies as of 04/24/2023   (No Known Allergies)    Family History  Problem Relation Age of Onset   Heart disease Father    Esophageal cancer Brother    Colon cancer Neg Hx    Rectal cancer Neg Hx    Stomach cancer Neg Hx    Colon polyps Neg Hx     Social History   Socioeconomic History   Marital status: Married    Spouse name: Not on file   Number of children: Not on file   Years of education: Not on file   Highest education level: Not on  file  Occupational History   Not on file  Tobacco Use   Smoking status: Never   Smokeless tobacco: Never  Substance and Sexual Activity   Alcohol use: No    Alcohol/week: 0.0 standard drinks of alcohol   Drug use: No   Sexual activity: Not on file  Other Topics Concern   Not on file  Social History Narrative   Not on file   Social Determinants of Health   Financial Resource Strain: Not on file  Food Insecurity: Not on file  Transportation Needs: Not on file  Physical Activity: Not on file  Stress: Not on file  Social Connections: Not on file  Intimate Partner Violence: Not on file    Review of  Systems:  All other review of systems negative except as mentioned in the HPI.  Physical Exam: Vital signs BP 136/88   Pulse 95   Temp 98.6 F (37 C)   Resp (!) 0   Ht 6\' 1"  (1.854 m)   Wt 190 lb (86.2 kg)   SpO2 95%   BMI 25.07 kg/m   General:   Alert,  Well-developed, well-nourished, pleasant and cooperative in NAD Lungs:  Clear throughout to auscultation.   Heart:  Regular rate and rhythm; no murmurs, clicks, rubs,  or gallops. Abdomen:  Soft, nontender and nondistended. Normal bowel sounds.   Neuro/Psych:  Alert and cooperative. Normal mood and affect. A and O x 3   @Ulysess Witz  Sena Slate, MD, [facility] [phone] (pager) 04/24/2023 2:27 PM@

## 2023-04-24 NOTE — Progress Notes (Signed)
Pt's states no medical or surgical changes since previsit or office visit. 

## 2023-04-25 ENCOUNTER — Telehealth: Payer: Self-pay

## 2023-04-25 NOTE — Telephone Encounter (Signed)
  Follow up Call-     04/24/2023    1:56 PM  Call back number  Post procedure Call Back phone  # [phone]  Permission to leave phone message Yes     Patient questions:  Do you have a fever, pain , or abdominal swelling? No. Pain Score  0 *  Have you tolerated food without any problems? Yes.    Have you been able to return to your normal activities? Yes.    Do you have any questions about your discharge instructions: Diet   No. Medications  No. Follow up visit  No.  Do you have questions or concerns about your Care? No.  Actions: * If pain score is 4 or above: No action needed, pain <4.

## 2023-05-01 ENCOUNTER — Other Ambulatory Visit (INDEPENDENT_AMBULATORY_CARE_PROVIDER_SITE_OTHER): Payer: 59

## 2023-05-01 DIAGNOSIS — Z0000 Encounter for general adult medical examination without abnormal findings: Secondary | ICD-10-CM

## 2023-05-01 DIAGNOSIS — E785 Hyperlipidemia, unspecified: Secondary | ICD-10-CM

## 2023-05-01 DIAGNOSIS — Z125 Encounter for screening for malignant neoplasm of prostate: Secondary | ICD-10-CM

## 2023-05-01 LAB — POCT URINALYSIS DIPSTICK
Bilirubin, UA: NEGATIVE
Blood, UA: NEGATIVE
Glucose, UA: NEGATIVE
Ketones, UA: NEGATIVE
Leukocytes, UA: NEGATIVE
Nitrite, UA: NEGATIVE
Protein, UA: NEGATIVE
Spec Grav, UA: 1.01 (ref 1.010–1.025)
Urobilinogen, UA: 0.2 E.U./dL
pH, UA: 6 (ref 5.0–8.0)

## 2023-05-01 LAB — CBC WITH DIFFERENTIAL/PLATELET
Absolute Monocytes: 504 cells/uL (ref 200–950)
Basophils Relative: 0.4 %
HCT: 47.8 % (ref 38.5–50.0)
Hemoglobin: 15.6 g/dL (ref 13.2–17.1)
MCH: 29.1 pg (ref 27.0–33.0)
MPV: 10.6 fL (ref 7.5–12.5)
Neutro Abs: 6955 cells/uL (ref 1500–7800)
Neutrophils Relative %: 71.7 %
RBC: 5.37 10*6/uL (ref 4.20–5.80)
RDW: 12.6 % (ref 11.0–15.0)
WBC: 9.7 10*3/uL (ref 3.8–10.8)

## 2023-05-01 NOTE — Addendum Note (Signed)
Addended by: Jama Flavors on: 05/01/2023 09:11 AM   Modules accepted: Orders

## 2023-05-02 ENCOUNTER — Ambulatory Visit: Payer: 59 | Admitting: Internal Medicine

## 2023-05-02 VITALS — BP 124/80 | HR 67 | Resp 16 | Ht 73.0 in

## 2023-05-02 DIAGNOSIS — Z0000 Encounter for general adult medical examination without abnormal findings: Secondary | ICD-10-CM | POA: Diagnosis not present

## 2023-05-02 DIAGNOSIS — Z23 Encounter for immunization: Secondary | ICD-10-CM

## 2023-05-02 DIAGNOSIS — M79671 Pain in right foot: Secondary | ICD-10-CM

## 2023-05-02 LAB — CBC WITH DIFFERENTIAL/PLATELET
Basophils Absolute: 39 cells/uL (ref 0–200)
Eosinophils Absolute: 87 cells/uL (ref 15–500)
Eosinophils Relative: 0.9 %
Lymphs Abs: 2115 cells/uL (ref 850–3900)
MCHC: 32.6 g/dL (ref 32.0–36.0)
MCV: 89 fL (ref 80.0–100.0)
Monocytes Relative: 5.2 %
Platelets: 273 10*3/uL (ref 140–400)
Total Lymphocyte: 21.8 %

## 2023-05-02 LAB — LIPID PANEL
Cholesterol: 183 mg/dL (ref <200)
HDL: 48 mg/dL (ref >=40)
LDL Cholesterol (Calc): 112 mg/dL (calc) — ABNORMAL HIGH
Non-HDL Cholesterol (Calc): 135 mg/dL (calc) — ABNORMAL HIGH (ref <130)
Total CHOL/HDL Ratio: 3.8 (calc) (ref <5.0)
Triglycerides: 122 mg/dL (ref <150)

## 2023-05-02 LAB — COMPLETE METABOLIC PANEL WITH GFR
AG Ratio: 1.9 (calc) (ref 1.0–2.5)
ALT: 12 U/L (ref 9–46)
AST: 12 U/L (ref 10–35)
Albumin: 4.1 g/dL (ref 3.6–5.1)
Alkaline phosphatase (APISO): 66 U/L (ref 35–144)
BUN: 14 mg/dL (ref 7–25)
CO2: 27 mmol/L (ref 20–32)
Calcium: 9.1 mg/dL (ref 8.6–10.3)
Chloride: 104 mmol/L (ref 98–110)
Creat: 1.13 mg/dL (ref 0.70–1.35)
Globulin: 2.2 g/dL (calc) (ref 1.9–3.7)
Glucose, Bld: 82 mg/dL (ref 65–99)
Potassium: 4.3 mmol/L (ref 3.5–5.3)
Sodium: 140 mmol/L (ref 135–146)
Total Bilirubin: 0.5 mg/dL (ref 0.2–1.2)
Total Protein: 6.3 g/dL (ref 6.1–8.1)
eGFR: 74 mL/min/{1.73_m2} (ref >=60)

## 2023-05-02 LAB — PSA: PSA: 1.27 ng/mL (ref <=4.00)

## 2023-05-02 MED ORDER — METHYLPREDNISOLONE 4 MG PO TBPK: ORAL_TABLET | ORAL | 0 refills | Status: DC

## 2023-05-02 MED ORDER — AZITHROMYCIN 250 MG PO TABS: ORAL_TABLET | ORAL | 1 refills | Status: AC

## 2023-05-13 NOTE — Progress Notes (Signed)
   Subjective:    Patient ID: John Hunt, male    DOB: [birth year], 62 y.o.   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  Past medical history: History of diverticulosis on colonoscopy.  In 2014 he had routine colonoscopy by Dr. Leone Payor and was found to have adenocarcinoma contained in 1 polyp and  also in a another benign tubular adenoma.  It was felt that removing these were curative.  He did have flex sig recall 4 months later it was a sessile polyp that was hyperplastic.  History for surgery for right trigeminal neuralgia at [facility] in 2005.  History of prostatitis 1993.  Injured elbow 1991.  No known drug allergies  Social history: Married.  Wife is also a International aid/development worker who works with him in Counselling psychologist and [employer].  2 adult children, a son and a daughter.  Non-smoker.  Does not consume alcohol.  Patient has a DVM degree from [employer].  Family history: Father died of an MI at age 79.  Mother with history of kidney and heart problems.  Brother with history of kidney stones and GI disorder.  Sister with history of kidney stones and GI disorder.  Brother died of laryngeal cancer.  Brother who was also a International aid/development worker deceased with a neurological disorder.  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  Review of Systems no complaint of chest pain, shortness of breath, GI symptoms.  Does have some musculoskeletal pain.     Objective:   Physical Exam Vital signs reviewed.  Skin: Warm and dry.  No cervical adenopathy.  No thyromegaly.  No carotid bruits.  Chest clear.  Cardiac exam: Regular rate and rhythm normal S1 and S2 without murmur or ectopy.  Abdomen no hepatosplenomegaly masses or tenderness.  Prostate is normal.  No lower extremity pitting edema.  No deformity of the extremities.  Brief neurological exam is  intact without gross focal deficits.  Affect thought and judgment are normal.       Assessment & Plan:   Normal health maintenance exam  Moderate musculoskeletal pain-have prescribed Medrol Dosepak to take in tapering course to see if this helps pain.  Very mild elevation of LDL cholesterol at 112 otherwise lipid panel is normal.  CBC, c-Met and PSA are all normal  Colonoscopy is up-to-date.  Tetanus immunization given.  Return in 1 year or as needed.

## 2023-05-16 ENCOUNTER — Encounter: Payer: Self-pay | Admitting: Internal Medicine

## 2023-05-16 NOTE — Patient Instructions (Addendum)
I have prescribed Medrol Dosepak to take for musculoskeletal pain.  Tetanus immunization update given.  LDL is mildly elevated at 413.  Colonoscopy done early June.  Follow-up in 1 year or as needed.  Consider coronary calcium scoring.  We can place order if you want to pursue this evaluation for coronary artery disease.  It was a pleasure to see you today.

## 2023-10-29 ENCOUNTER — Ambulatory Visit
Admission: RE | Admit: 2023-10-29 | Discharge: 2023-10-29 | Disposition: A | Discharge status: Home / Self Care | Payer: 59 | Source: Ambulatory Visit | Attending: Internal Medicine | Admitting: Internal Medicine

## 2023-10-29 ENCOUNTER — Encounter: Payer: Self-pay | Admitting: Internal Medicine

## 2023-10-29 ENCOUNTER — Ambulatory Visit (INDEPENDENT_AMBULATORY_CARE_PROVIDER_SITE_OTHER): Payer: 59 | Admitting: Internal Medicine

## 2023-10-29 VITALS — BP 120/80 | HR 64 | Ht 73.0 in | Wt 201.0 lb

## 2023-10-29 DIAGNOSIS — M25511 Pain in right shoulder: Secondary | ICD-10-CM | POA: Diagnosis not present

## 2023-10-29 DIAGNOSIS — Q740 Other congenital malformations of upper limb(s), including shoulder girdle: Secondary | ICD-10-CM

## 2023-10-29 NOTE — Progress Notes (Signed)
Patient Care Team: Margaree Mackintosh, MD as PCP - General (Internal Medicine)  Visit Date: 10/29/23  Subjective:    Patient ID: John Hunt , Male   DOB: 08-16-1961, 62 y.o.    MRN: [id number]   62 y.o. Male presents today for bump on right clavicle near sternal aspect that he noticed.No known trauma. It is not tender to touch. 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 Photographer. Nonsmoker. Married with 2 children, a son and a daughter.     Review of Systems  Constitutional:  Negative for fever and malaise/fatigue.  HENT:  Negative for congestion.   Eyes:  Negative for blurred vision.  Respiratory:  Negative for cough and shortness of breath.   Cardiovascular:  Negative for chest pain, palpitations and leg swelling.  Gastrointestinal:  Negative for vomiting.  Musculoskeletal:  Negative for back pain.       (+) Bump right AC joint  Skin:  Negative for rash.  Neurological:  Negative for loss of consciousness and headaches.        Objective:   Vitals: BP 120/80   Pulse 64   Ht 6\' 1"  (1.854 m)   Wt 201 lb (91.2 kg)   SpO2 97%   BMI 26.52 kg/m    Physical Exam Vitals and nursing note reviewed.  Constitutional:      General: He is not in acute distress.    Appearance: Normal appearance. He is not ill-appearing.  HENT:     Head: Normocephalic and atraumatic.  Cardiovascular:     Rate and Rhythm: Normal rate and regular rhythm.     Pulses: Normal pulses.      Heart sounds: Normal heart sounds. No murmur heard.    No friction rub. No gallop.  Pulmonary:     Effort: Pulmonary effort is normal. No respiratory distress.     Breath sounds: Normal breath sounds. No wheezing or rales.  Musculoskeletal:     Comments: Small bony prominence right AC joint.  Skin:    General: Skin is warm and dry.  Neurological:     Mental Status: He is alert and oriented to person, place, and time. Mental status is at baseline.  Psychiatric:        Mood and Affect: Mood normal.        Behavior: Behavior normal.        Thought Content: Thought content normal.        Judgment: Judgment normal.       Results:   Studies obtained and personally reviewed by me:   Labs:       Component Value Date/Time   NA 140 05/01/2023 0913   K 4.3 05/01/2023 0913   CL 104 05/01/2023 0913   CO2 27 05/01/2023 0913   GLUCOSE 82 05/01/2023 0913   BUN 14 05/01/2023 0913   CREATININE 1.13  05/01/2023 0913   CALCIUM 9.1 05/01/2023 0913   PROT 6.3 05/01/2023 0913   ALBUMIN 3.6 08/11/2014 1605   AST 12 05/01/2023 0913   ALT 12 05/01/2023 0913   ALKPHOS 57 08/11/2014 1605   BILITOT 0.5 05/01/2023 0913   GFRNONAA 80 (L) 08/11/2014 1605   GFRAA >90 08/11/2014 1605     Lab Results  Component Value Date   WBC 9.7 05/01/2023   HGB 15.6 05/01/2023   HCT 47.8 05/01/2023   MCV 89.0 05/01/2023   PLT 273 05/01/2023    Lab Results  Component Value Date   CHOL 183 05/01/2023   HDL 48 05/01/2023   LDLCALC 112 (H) 05/01/2023   TRIG 122 05/01/2023   CHOLHDL 3.8 05/01/2023    No results found for: "HGBA1C"   Lab Results  Component Value Date   TSH 1.217 09/06/2012     Lab Results  Component Value Date   PSA 1.27 05/01/2023   PSA 0.81 08/20/2020   PSA 0.71 09/06/2012      Assessment & Plan:   Bony prominence right AC joint: ordered XR AC joints. Will contact with results.    I,Alexander Ruley,acting as a Neurosurgeon for Margaree Mackintosh, MD.,have documented all  relevant documentation on the behalf of Margaree Mackintosh, MD,as directed by  Margaree Mackintosh, MD while in the presence of Margaree Mackintosh, MD.   I, Margaree Mackintosh, MD, have reviewed all documentation for this visit. The documentation on 11/16/23 for the exam, diagnosis, procedures, and orders are all accurate and complete.

## 2023-11-16 NOTE — Patient Instructions (Signed)
It was a pleasure to see you today.

## 2024-01-13 ENCOUNTER — Emergency Department (HOSPITAL_COMMUNITY)
Admission: EM | Admit: 2024-01-13 | Discharge: 2024-01-13 | Disposition: A | Discharge status: Home / Self Care | Payer: 59 | Attending: Emergency Medicine | Admitting: Emergency Medicine

## 2024-01-13 ENCOUNTER — Encounter (HOSPITAL_COMMUNITY): Payer: Self-pay

## 2024-01-13 ENCOUNTER — Emergency Department (HOSPITAL_COMMUNITY): Payer: 59

## 2024-01-13 DIAGNOSIS — I4891 Unspecified atrial fibrillation: Secondary | ICD-10-CM

## 2024-01-13 DIAGNOSIS — Z85048 Personal history of other malignant neoplasm of rectum, rectosigmoid junction, and anus: Secondary | ICD-10-CM | POA: Insufficient documentation

## 2024-01-13 DIAGNOSIS — R000 Tachycardia, unspecified: Secondary | ICD-10-CM | POA: Diagnosis present

## 2024-01-13 DIAGNOSIS — I4819 Other persistent atrial fibrillation: Secondary | ICD-10-CM

## 2024-01-13 LAB — CBC
HCT: 51.4 % (ref 39.0–52.0)
Hemoglobin: 16.9 g/dL (ref 13.0–17.0)
MCH: 29.4 pg (ref 26.0–34.0)
MCHC: 32.9 g/dL (ref 30.0–36.0)
MCV: 89.4 fL (ref 80.0–100.0)
Platelets: 250 10*3/uL (ref 150–400)
RBC: 5.75 MIL/uL (ref 4.22–5.81)
RDW: 14 % (ref 11.5–15.5)
WBC: 9.6 10*3/uL (ref 4.0–10.5)
nRBC: 0 % (ref 0.0–0.2)

## 2024-01-13 LAB — I-STAT CHEM 8, ED
BUN: 16 mg/dL (ref 8–23)
Calcium, Ion: 1.16 mmol/L (ref 1.15–1.40)
Chloride: 106 mmol/L (ref 98–111)
Creatinine, Ser: 1.2 mg/dL (ref 0.61–1.24)
Glucose, Bld: 83 mg/dL (ref 70–99)
HCT: 50 % (ref 39.0–52.0)
Hemoglobin: 17 g/dL (ref 13.0–17.0)
Potassium: 3.8 mmol/L (ref 3.5–5.1)
Sodium: 141 mmol/L (ref 135–145)
TCO2: 25 mmol/L (ref 22–32)

## 2024-01-13 LAB — TROPONIN I (HIGH SENSITIVITY): Troponin I (High Sensitivity): 10 ng/L (ref <18)

## 2024-01-13 LAB — BASIC METABOLIC PANEL
Anion gap: 9 (ref 5–15)
BUN: 14 mg/dL (ref 8–23)
CO2: 25 mmol/L (ref 22–32)
Calcium: 9.1 mg/dL (ref 8.9–10.3)
Chloride: 105 mmol/L (ref 98–111)
Creatinine, Ser: 1.25 mg/dL — ABNORMAL HIGH (ref 0.61–1.24)
GFR, Estimated: >60 mL/min (ref >60)
Glucose, Bld: 90 mg/dL (ref 70–99)
Potassium: 4 mmol/L (ref 3.5–5.1)
Sodium: 139 mmol/L (ref 135–145)

## 2024-01-13 LAB — TSH: TSH: 2.235 u[IU]/mL (ref 0.350–4.500)

## 2024-01-13 LAB — MAGNESIUM: Magnesium: 2.1 mg/dL (ref 1.7–2.4)

## 2024-01-13 MED ORDER — DILTIAZEM HCL ER 60 MG PO CP12: 60.0000 mg | ORAL_CAPSULE | Freq: Two times a day (BID) | ORAL | 0 refills | Status: DC

## 2024-01-13 MED ORDER — APIXABAN 5 MG PO TABS
5.0000 mg | ORAL_TABLET | Freq: Once | ORAL | Status: AC
  Administered 2024-01-13: 5 mg via ORAL
  Filled 2024-01-13: qty 1

## 2024-01-13 MED ORDER — DILTIAZEM HCL ER 60 MG PO CP12
60.0000 mg | ORAL_CAPSULE | Freq: Two times a day (BID) | ORAL | Status: DC
  Filled 2024-01-13: qty 1

## 2024-01-13 MED ORDER — DILTIAZEM LOAD VIA INFUSION
20.0000 mg | Freq: Once | INTRAVENOUS | Status: AC
  Administered 2024-01-13: 20 mg via INTRAVENOUS
  Filled 2024-01-13 (×2): qty 20

## 2024-01-13 MED ORDER — DILTIAZEM HCL 60 MG PO TABS
60.0000 mg | ORAL_TABLET | Freq: Once | ORAL | Status: DC
  Administered 2024-01-13: 60 mg via ORAL
  Filled 2024-01-13: qty 1

## 2024-01-13 MED ORDER — APIXABAN 5 MG PO TABS: 5.0000 mg | ORAL_TABLET | Freq: Two times a day (BID) | ORAL | 0 refills | Status: DC

## 2024-01-13 MED ORDER — DILTIAZEM HCL-DEXTROSE 125-5 MG/125ML-% IV SOLN (PREMIX)
5.0000 mg/h | INTRAVENOUS | Status: DC
  Administered 2024-01-13: 5 mg/h via INTRAVENOUS
  Filled 2024-01-13: qty 125

## 2024-01-13 NOTE — ED Triage Notes (Signed)
 Pt c/o chest tightness and increasing SOB starting this morning.  Pain score 1/10.    Family history of A Fib.

## 2024-01-13 NOTE — Discharge Instructions (Addendum)
 Take the Cardizem (diltiazem) 2 times a day but check your heart rate and blood pressure before you take the tablet.  Take your first dose before bed tonight. If your heart rate is less than 50 or your blood pressure is less than 110/60 hold that 1 dose of medication but do not stop the medication altogether.  For the next dose recheck your vital signs and as long as you are heart rate is not less than 50 or your blood pressure less than 110/60 continue to take the dose of medication.  You will take the blood thinner twice a day until you see cardiology.  Somebody from the office will call and follow-up with you.  If you start having lightheadedness, passing out, shortness of breath or chest pain return to the emergency room.

## 2024-01-13 NOTE — ED Provider Notes (Signed)
 Patient signed out to me at 1530 by Dr. Anitra Lauth pending transition to p.o. diltiazem and observation off diltiazem drip.  In short this is a 63 year old male who presented to the emergency department with tachycardia and was found to be in new onset A-fib with RVR.  He was asymptomatic and is unclear when the symptoms started.  He was given a diltiazem bolus and started on a drip and his heart rate improved to the 70s.  His labs are within normal range.  Previous provider spoke with cardiology who recommended transition to oral Cardizem and Eliquis and to monitor off the Cardizem drip and if he remains controlled can follow-up outpatient in the A-fib clinic.  Clinical Course as of 01/13/24 1954  Sun Jan 13, 2024  1710 Dilt drip turned off at 5PM, received PO dilt at 4 PM. Will monitor for 1-2 hours off the drip prior to dc. [VK]  1927 Patient has been observed for several hours off the diltiazem drip and has remained in rate controlled A-fib.  He stable for discharge home.  Was instructed to take his next dose of diltiazem before bed tonight and plans to purchase His prescriptions with outpatient A-fib clinic follow-up. [VK]    Clinical Course User Index [VK] Rexford Maus, DO      Rexford Maus, [place] 01/13/24 [phone]

## 2024-01-13 NOTE — ED Provider Triage Note (Signed)
 Emergency Medicine Provider Triage Evaluation Note  John Hunt , a 63 y.o. male  was evaluated in triage.  Pt complains of afib.  Review of Systems  Positive:  Negative:   Physical Exam  BP (!) 129/93   Pulse 70   Temp 97.9 F (36.6 C)   Resp 17   Ht 6\' 1"  (1.854 m)   Wt 88.5 kg   SpO2 100%   BMI 25.73 kg/m  Gen:   Awake, no distress   Resp:  Normal effort  MSK:   Moves extremities without difficulty  Other:    Medical Decision Making  Medically screening exam initiated at 1:26 PM.  Appropriate orders placed.  John Hunt was informed that the remainder of the evaluation will be completed by another provider, this initial triage assessment does not replace that evaluation, and the importance of remaining in the ED until their evaluation is complete.  Patient with some SOB, chest tightness and fatigue today. Went to donate blood but they turned him away because he was tachycardic. No hx of afib in patient. His siblings do have afib tho. Only other infectious symptom is urinary frequency. Patient also stating that he has been drinking caffeine recently. Also states that he takes testosterone pills once every other day.   Dorthy Cooler, [place] 01/13/24 1328

## 2024-01-13 NOTE — Progress Notes (Signed)
 ON-CALL CARDIOLOGY 01/13/24  Patient's name: John Hunt.   MRN: [id number].    DOB: 1961-08-21 Primary care provider: Margaree Mackintosh, MD. Primary cardiologist: None  Patient location: Emergency room department. Physician location: [facility]  Interaction regarding this patient's care today: ER physician Dr. Anitra Lauth reached out to discuss patient's case.  Patient went to go donate blood and was noted to have elevated heart rate was referred to emergency room department for further evaluation and management.  Patient was noted to be in A-fib with RVR.  He was given Cardizem bolus followed by Cardizem drip.  ED physician informs me that he is rate controlled A-fib on Cardizem drip.  ED physician informs me that the CHA2DS2-VASc SCORE is 0.  Duration of atrial fibrillation is unknown.  LABORATORY DATA:    Latest Ref Rng & Units 01/13/2024    2:06 PM 01/13/2024    1:27 PM 05/01/2023    9:13 AM  CBC  WBC 4.0 - 10.5 K/uL  9.6  9.7   Hemoglobin 13.0 - 17.0 g/dL 40.9  81.1  91.4   Hematocrit 39.0 - 52.0 % 50.0  51.4  47.8   Platelets 150 - 400 K/uL  250  273        Latest Ref Rng & Units 01/13/2024    2:06 PM 01/13/2024    1:27 PM 05/01/2023    9:13 AM  CMP  Glucose 70 - 99 mg/dL 83  90  82   BUN 8 - 23 mg/dL 16  14  14    Creatinine 0.61 - 1.24 mg/dL 7.82  9.56  2.13   Sodium 135 - 145 mmol/L 141  139  140   Potassium 3.5 - 5.1 mmol/L 3.8  4.0  4.3   Chloride 98 - 111 mmol/L 106  105  104   CO2 22 - 32 mmol/L  25  27   Calcium 8.9 - 10.3 mg/dL  9.1  9.1   Total Protein 6.1 - 8.1 g/dL   6.3   Total Bilirubin 0.2 - 1.2 mg/dL   0.5   AST 10 - 35 U/L   12   ALT 9 - 46 U/L   12     Lipid Panel     Component Value Date/Time   CHOL 183 05/01/2023 0913   TRIG 122 05/01/2023 0913   HDL 48 05/01/2023 0913   CHOLHDL 3.8 05/01/2023 0913   VLDL 20 03/11/2013 0952   LDLCALC 112 (H) 05/01/2023 0913    No components found for: "NTPROBNP" No results for input(s):  "PROBNP" in the last 8760 hours. No results for input(s): "TSH" in the last 8760 hours.  EKG 01/12/2023: Atrial fibrillation with rapid ventricular rate, 147 bpm, without underlying injury pattern.   Impression: New Onset of Afib   Recommendations: ED physician plan on d/c the patient home and wanted to review the plan of care.   Since the duration of atrial fibrillation is unknown shared decision was to anticoagulate the patient.  Based on insurance coverage likely will be discharged on DOAC based on cost and renal function.  Please confirm that he has no contraindications to anticoagulation.   Recommend repeating EKG prior to discharge.  Recommend transitioning to Cardizem SR 60 mg p.o. twice daily with holding parameters hold if systolic blood pressures <100 mmHg or heart rate <60 bpm.  Will arrange follow-up with A-fib clinic in approximately 4 weeks to reevaluate his rhythm and if he remains in afib to discuss possible cardioversion.  If his CHA2DS2-VASc SCORE is 0 is truly 0 he may not need long-term oral anticoagulation.  This will need to be discussed at follow-up visits.  Return to ER is he has chest pain, shortness of breath, lightheadedness, dizziness, near-syncope or syncopal events.  Telephone encounter total time: 11 minutes.   Tessa Lerner, DO, [facility]  [facility]  [address] [place], [place] [address] 01/13/2024 4:17 PM

## 2024-01-13 NOTE — ED Provider Notes (Signed)
 [place] [facility] AT [facility] Provider Note   CSN: [id number] Arrival date & time: 01/13/24  1257     History  Chief Complaint  Patient presents with   Chest Pain   Shortness of Breath    John Hunt is a 63 y.o. male.  Patient is a 63 year old male with a history of prior kidney stones and adenocarcinoma of the rectosigmoid junction in 2014 that has not required any further therapy who is presenting today because he went to give blood today and they found that he was tachycardic.  Patient reports he has no idea how long this has been going on as he has not noticed any shortness of breath, DOE, lower extremity swelling, chest pain or palpitations.  His family member who is present at bedside said that he has been fatigued when he works but he denies that that is even an issue.  He has a family history of 2 brothers with A-fib but has never been diagnosed with it himself.  Other than taking Clomid for low testosterone he is on no other medications.  Denies any recent illness but did state he got a flu shot and a COVID shot at the beginning of the week.  The history is provided by the patient.  Chest Pain Associated symptoms: shortness of breath   Shortness of Breath Associated symptoms: chest pain        Home Medications Prior to Admission medications   Medication Sig Start Date End Date Taking? Authorizing Provider  clomiPHENE (CLOMID) 50 MG tablet  12/18/21   [provider]  Roflumilast (ZORYVE) 0.3 % CREA Apply 1 application  topically daily. 04/10/23   Deirdre Evener, MD      Allergies    Patient has no known allergies.    Review of Systems   Review of Systems  Respiratory:  Positive for shortness of breath.   Cardiovascular:  Positive for chest pain.    Physical Exam Updated Vital Signs BP (!) 129/93   Pulse 70   Temp 97.9 F (36.6 C)   Resp 17   Ht 6\' 1"  (1.854 m)   Wt 88.5 kg   SpO2 100%   BMI 25.73 kg/m   Physical Exam Vitals and nursing note reviewed.  Constitutional:      General: He is not in acute distress.    Appearance: He is well-developed.  HENT:     Head: Normocephalic and atraumatic.  Eyes:     Conjunctiva/sclera: Conjunctivae normal.     Pupils: Pupils are equal, round, and reactive to light.  Cardiovascular:     Rate and Rhythm: Tachycardia present. Rhythm irregularly irregular.     Heart sounds: No murmur heard. Pulmonary:     Effort: Pulmonary effort is normal. No respiratory distress.     Breath sounds: Normal breath sounds. No wheezing or rales.  Abdominal:     General: There is no distension.     Palpations: Abdomen is soft.     Tenderness: There is no abdominal tenderness. There is no guarding or rebound.  Musculoskeletal:        General: No tenderness. Normal range of motion.     Cervical back: Normal range of motion and neck supple.  Skin:    General: Skin is warm and dry.     Findings: No erythema or rash.  Neurological:     Mental Status: He is alert and oriented to person, place, and time.  Psychiatric:  Behavior: Behavior normal.     ED Results / Procedures / Treatments   Labs (all labs ordered are listed, but only abnormal results are displayed) Labs Reviewed  BASIC METABOLIC PANEL - Abnormal; Notable for the following components:      Result Value   Creatinine, Ser 1.25 (*)    All other components within normal limits  CBC  MAGNESIUM  TSH  I-STAT CHEM 8, ED  TROPONIN I (HIGH SENSITIVITY)  TROPONIN I (HIGH SENSITIVITY)    EKG EKG Interpretation Date/Time:  Sunday January 13 2024 13:11:59 EST Ventricular Rate:  147 PR Interval:    QRS Duration:  70 QT Interval:  300 QTC Calculation: 469 R Axis:   51  Text Interpretation: Atrial fibrillation with rapid ventricular response No previous ECGs available Confirmed by Gwyneth Sprout ([id number]) on 01/13/2024 2:04:11 PM  Radiology DG Chest 2 View Result Date: 01/13/2024 CLINICAL  DATA:  Chest pain. EXAM: CHEST - 2 VIEW COMPARISON:  Chest two views 12/09/2018, 06/13/2004 FINDINGS: There is again moderate elevation of the left hemidiaphragm. Cardiac silhouette and mediastinal contours are within limits. Minimal left basilar linear bronchovascular crowding. No focal airspace opacity. No pleural effusion or pneumothorax. Mild multilevel degenerative disc changes of the thoracic spine. IMPRESSION: 1. No active cardiopulmonary disease. 2. Moderate elevation of the left hemidiaphragm, unchanged from 11/21/2018 but again increased from 06/13/2004. Electronically Signed   By: Neita Garnet M.D.   On: 01/13/2024 14:31    Procedures Procedures    Medications Ordered in ED Medications  diltiazem (CARDIZEM) 1 mg/mL load via infusion 20 mg (20 mg Intravenous Bolus from Bag 01/13/24 1518)    And  diltiazem (CARDIZEM) 125 mg in dextrose 5% 125 mL (1 mg/mL) infusion (5 mg/hr Intravenous New Bag/Given 01/13/24 1519)    ED Course/ Medical Decision Making/ A&P                                 Medical Decision Making Amount and/or Complexity of Data Reviewed External Data Reviewed: notes. Labs: ordered. Decision-making details documented in ED Course. Radiology: ordered and independent interpretation performed. Decision-making details documented in ED Course. ECG/medicine tests: ordered and independent interpretation performed. Decision-making details documented in ED Course.  Risk Prescription drug management.   Pt with multiple medical problems and comorbidities and presenting today with a complaint that caries a high risk for morbidity and mortality.  Here today due to elevated heart rate and I independently interpreted patient's EKG and continuous bedside monitors consistent with A-fib RVR.  Rates are anywhere from 118-140.  Patient has no evidence of fluid overload and denies any other symptoms.  Chadvasc of 0.  I independently interpreted patient's labs and CBC, CMP, magnesium and  troponin all without acute findings.  I have independently visualized and interpreted pt's images today.  Chest x-ray with elevation of his hemidiaphragm on the left but no other acute findings.  Radiology reports no acute findings. Will give patient Cardizem for rate control.  3:39 PM After IV Cardizem patient's heart rate is now in the 70s.  Will give a dose of oral Cardizem and started on anticoagulation per cardiology.  After patient has had the tablet will discontinue the Cardizem drip and ensure patient's heart rate remains controlled.  Feel otherwise that he can be discharged home and follow-up in the A-fib clinic.  CRITICAL CARE Performed by: Dea Bitting Total critical care time: 30 minutes Critical care time was exclusive  of separately billable procedures and treating other patients. Critical care was necessary to treat or prevent imminent or life-threatening deterioration. Critical care was time spent personally by me on the following activities: development of treatment plan with patient and/or surrogate as well as nursing, discussions with consultants, evaluation of patient's response to treatment, examination of patient, obtaining history from patient or surrogate, ordering and performing treatments and interventions, ordering and review of laboratory studies, ordering and review of radiographic studies, pulse oximetry and re-evaluation of patient's condition.        Final Clinical Impression(s) / ED Diagnoses Final diagnoses:  New onset atrial fibrillation [facility])    Rx / DC Orders ED Discharge Orders     None         Gwyneth Sprout, MD 01/13/24 1557

## 2024-01-14 ENCOUNTER — Telehealth: Payer: Self-pay | Admitting: Internal Medicine

## 2024-01-14 NOTE — Telephone Encounter (Signed)
 Referral placed.

## 2024-01-14 NOTE — Telephone Encounter (Signed)
 Araceli returned your call, and he is okay with a referral to cardiology. He said Thank You for calling him.

## 2024-01-23 ENCOUNTER — Encounter: Payer: Self-pay | Admitting: Dermatology

## 2024-01-23 ENCOUNTER — Encounter: Payer: Self-pay | Admitting: Internal Medicine

## 2024-01-23 ENCOUNTER — Ambulatory Visit: Payer: 59 | Admitting: Dermatology

## 2024-01-23 ENCOUNTER — Ambulatory Visit (INDEPENDENT_AMBULATORY_CARE_PROVIDER_SITE_OTHER): Admitting: Internal Medicine

## 2024-01-23 ENCOUNTER — Encounter (HOSPITAL_COMMUNITY): Payer: Self-pay | Admitting: Internal Medicine

## 2024-01-23 ENCOUNTER — Ambulatory Visit (HOSPITAL_COMMUNITY)
Admission: RE | Admit: 2024-01-23 | Discharge: 2024-01-23 | Disposition: A | Discharge status: Home / Self Care | Payer: 59 | Source: Ambulatory Visit | Attending: Internal Medicine | Admitting: Internal Medicine

## 2024-01-23 VITALS — Ht 73.0 in

## 2024-01-23 VITALS — BP 128/90 | HR 105 | Ht 73.0 in | Wt 202.0 lb

## 2024-01-23 DIAGNOSIS — L820 Inflamed seborrheic keratosis: Secondary | ICD-10-CM | POA: Diagnosis not present

## 2024-01-23 DIAGNOSIS — Z7189 Other specified counseling: Secondary | ICD-10-CM

## 2024-01-23 DIAGNOSIS — W908XXA Exposure to other nonionizing radiation, initial encounter: Secondary | ICD-10-CM

## 2024-01-23 DIAGNOSIS — Z79899 Other long term (current) drug therapy: Secondary | ICD-10-CM | POA: Insufficient documentation

## 2024-01-23 DIAGNOSIS — Z7901 Long term (current) use of anticoagulants: Secondary | ICD-10-CM | POA: Diagnosis not present

## 2024-01-23 DIAGNOSIS — F419 Anxiety disorder, unspecified: Secondary | ICD-10-CM

## 2024-01-23 DIAGNOSIS — L821 Other seborrheic keratosis: Secondary | ICD-10-CM

## 2024-01-23 DIAGNOSIS — Z1283 Encounter for screening for malignant neoplasm of skin: Secondary | ICD-10-CM | POA: Diagnosis not present

## 2024-01-23 DIAGNOSIS — I4819 Other persistent atrial fibrillation: Secondary | ICD-10-CM | POA: Insufficient documentation

## 2024-01-23 DIAGNOSIS — L578 Other skin changes due to chronic exposure to nonionizing radiation: Secondary | ICD-10-CM | POA: Diagnosis not present

## 2024-01-23 DIAGNOSIS — L304 Erythema intertrigo: Secondary | ICD-10-CM

## 2024-01-23 DIAGNOSIS — I4891 Unspecified atrial fibrillation: Secondary | ICD-10-CM

## 2024-01-23 DIAGNOSIS — L814 Other melanin hyperpigmentation: Secondary | ICD-10-CM

## 2024-01-23 DIAGNOSIS — L408 Other psoriasis: Secondary | ICD-10-CM | POA: Diagnosis not present

## 2024-01-23 DIAGNOSIS — D229 Melanocytic nevi, unspecified: Secondary | ICD-10-CM

## 2024-01-23 DIAGNOSIS — D1801 Hemangioma of skin and subcutaneous tissue: Secondary | ICD-10-CM

## 2024-01-23 DIAGNOSIS — L409 Psoriasis, unspecified: Secondary | ICD-10-CM

## 2024-01-23 MED ORDER — ALPRAZOLAM 0.5 MG PO TABS: 0.5000 mg | ORAL_TABLET | Freq: Three times a day (TID) | ORAL | 0 refills | Status: DC | PRN

## 2024-01-23 MED ORDER — APIXABAN 5 MG PO TABS: 5.0000 mg | ORAL_TABLET | Freq: Two times a day (BID) | ORAL | 1 refills | Status: DC

## 2024-01-23 MED ORDER — ZORYVE 0.3 % EX CREA: 1.0000 | TOPICAL_CREAM | Freq: Every day | CUTANEOUS | 11 refills | Status: AC

## 2024-01-23 MED ORDER — DILTIAZEM HCL ER 120 MG PO CP12: 120.0000 mg | ORAL_CAPSULE | Freq: Two times a day (BID) | ORAL | 1 refills | Status: DC

## 2024-01-23 NOTE — H&P (View-Only) (Signed)
 Primary Care Physician: Margaree Mackintosh, MD Primary Cardiologist: None Electrophysiologist: None     Referring Physician: Dr. Laneta Simmers is a 63 y.o. male with a history of prior kidney stones, adenocarcinoma of the rectosigmoid junction in 2014, and atrial fibrillation who presents for consultation in the [facility]. ED visit on 01/13/24 for tachycardia found to be in new onset Afib with RVR. Discharged on diltiazem 60 mg BID in rate controlled Afib. Dr. Odis Hollingshead noted via telephone encounter to start patient on [facility] due to unknown length of time being in Afib. Patient is on Eliquis 5 mg BID for a CHADS2VASC score of zero.  On evaluation today, he is currently in Afib. He began Eliquis 5 mg BID on 2/24 with two complete doses. He increased diltiazem to 120 mg BID and feels better since doing so. He has a Research scientist (life sciences). He does snore. Patient is a International aid/development worker.  Today, he denies symptoms of palpitations, chest pain, shortness of breath, orthopnea, PND, lower extremity edema, dizziness, presyncope, syncope, snoring, daytime somnolence, bleeding, or neurologic sequela. The patient is tolerating medications without difficulties and is otherwise without complaint today.    Atrial Fibrillation Risk Factors:  he does have symptoms or diagnosis of sleep apnea.   he has a BMI of Body mass index is 26.65 kg/m.Marland Kitchen Filed Weights   01/23/24 1039  Weight: 91.6 kg    Current Outpatient Medications  Medication Sig Dispense Refill   clomiPHENE (CLOMID) 50 MG tablet Take 25 mg by mouth at bedtime.     Roflumilast (ZORYVE) 0.3 % CREA Apply 1 application  topically daily. qd to aa psoriasis face, scalp, body 60 g 11   apixaban (ELIQUIS) 5 MG TABS tablet Take 1 tablet (5 mg total) by mouth 2 (two) times daily. 180 tablet 1   diltiazem (CARDIZEM SR) 120 MG 12 hr capsule Take 1 capsule (120 mg total) by mouth 2 (two) times daily. 180 capsule 1   No current  facility-administered medications for this encounter.    Atrial Fibrillation Management history:  Previous antiarrhythmic drugs: none Previous cardioversions: none Previous ablations: none Anticoagulation history: Eliquis 5 mg BID   ROS- All systems are reviewed and negative except as per the HPI above.  Physical Exam: BP (!) 128/90   Pulse (!) 105   Ht 6\' 1"  (1.854 m)   Wt 91.6 kg   BMI 26.65 kg/m   GEN: Well nourished, well developed in no acute distress NECK: No JVD; No carotid bruits CARDIAC: Irregularly irregular rate and rhythm, no murmurs, rubs, gallops RESPIRATORY:  Clear to auscultation without rales, wheezing or rhonchi  ABDOMEN: Soft, non-tender, non-distended EXTREMITIES:  No edema; No deformity   EKG today demonstrates  Vent. rate 105 BPM PR interval * ms QRS duration 86 ms QT/QTcB 346/457 ms P-R-T axes * 39 18 Atrial fibrillation with rapid ventricular response Abnormal ECG When compared with ECG of 13-Jan-2024 17:41, PREVIOUS ECG IS PRESENT  Echo N/A  ASSESSMENT & PLAN CHA2DS2-VASc Score = 0  The patient's score is based upon: CHF History: 0 HTN History: 0 Diabetes History: 0 Stroke History: 0 Vascular Disease History: 0 Age Score: 0 Gender Score: 0       ASSESSMENT AND PLAN: Persistent Atrial Fibrillation (ICD10:  I48.19) The patient's CHA2DS2-VASc score is 0, indicating a 0.2% annual risk of stroke.    He is currently in Afib. Continue diltiazem 120 mg BID. Continue Eliquis 5 mg BID.  Labs drawn in ED on 2/23. Education provided about Afib. Discussion about medication treatments and ablation going forward if indicated. We talked about cardioversion as procedure to help try to convert to NSR. After discussion of risks vs benefits, patient agrees would like to proceed with cardioversion. We will plan for patient to stop Eliquis 4 weeks from date of cardioversion. Will refer for sleep study. Will order baseline echocardiogram once patient is back  in NSR. He has an upcoming visit with Dr. Royann Shivers to establish care. Continue monitoring rhythm with Kardiamobile device.   Informed Consent   Shared Decision Making/Informed Consent The risks (stroke, cardiac arrhythmias rarely resulting in the need for a temporary or permanent pacemaker, skin irritation or burns and complications associated with conscious sedation including aspiration, arrhythmia, respiratory failure and death), benefits (restoration of normal sinus rhythm) and alternatives of a direct current cardioversion were explained in detail to Mr. Calandra and he agrees to proceed.         Follow up 2 weeks after DCCV.   Lake Bells, PA-C  Afib Clinic [facility] [address] [place], [place] [address] [phone]

## 2024-01-23 NOTE — Progress Notes (Addendum)
 Primary Care Physician: Margaree Mackintosh, MD Primary Cardiologist: None Electrophysiologist: None     Referring Physician: Dr. Laneta Simmers is a 63 y.o. male with a history of prior kidney stones, adenocarcinoma of the rectosigmoid junction in 2014, and atrial fibrillation who presents for consultation in the [facility]ibr[facility]. ED visit on 01/13/24 for tachycardia found to be in new onset Afib with RVR. Discharged on diltiazem 60 mg BID in rate controlled Afib. Dr. Odis Hollingshead noted via telephone encounter to start patient on [facility] due to unknown length of time being in Afib. Patient is on Eliquis 5 mg BID for a CHADS2VASC score of zero.  On evaluation today, he is currently in Afib. He began Eliquis 5 mg BID on 2/24 with two complete doses. He increased diltiazem to 120 mg BID and feels better since doing so. He has a Research scientist (life sciences). He does snore. Patient is a International aid/development worker.  Today, he denies symptoms of palpitations, chest pain, shortness of breath, orthopnea, PND, lower extremity edema, dizziness, presyncope, syncope, snoring, daytime somnolence, bleeding, or neurologic sequela. The patient is tolerating medications without difficulties and is otherwise without complaint today.    Atrial Fibrillation Risk Factors:  he does have symptoms or diagnosis of sleep apnea.   he has a BMI of Body mass index is 26.65 kg/m.Marland Kitchen Filed Weights   01/23/24 1039  Weight: 91.6 kg    Current Outpatient Medications  Medication Sig Dispense Refill   clomiPHENE (CLOMID) 50 MG tablet Take 25 mg by mouth at bedtime.     Roflumilast (ZORYVE) 0.3 % CREA Apply 1 application  topically daily. qd to aa psoriasis face, scalp, body 60 g 11   apixaban (ELIQUIS) 5 MG TABS tablet Take 1 tablet (5 mg total) by mouth 2 (two) times daily. 180 tablet 1   diltiazem (CARDIZEM SR) 120 MG 12 hr capsule Take 1 capsule (120 mg total) by mouth 2 (two) times daily. 180 capsule 1   No current  facility-administered medications for this encounter.    Atrial Fibrillation Management history:  Previous antiarrhythmic drugs: none Previous cardioversions: none Previous ablations: none Anticoagulation history: Eliquis 5 mg BID   ROS- All systems are reviewed and negative except as per the HPI above.  Physical Exam: BP (!) 128/90   Pulse (!) 105   Ht 6\' 1"  (1.854 m)   Wt 91.6 kg   BMI 26.65 kg/m   GEN: Well nourished, well developed in no acute distress NECK: No JVD; No carotid bruits CARDIAC: Irregularly irregular rate and rhythm, no murmurs, rubs, gallops RESPIRATORY:  Clear to auscultation without rales, wheezing or rhonchi  ABDOMEN: Soft, non-tender, non-distended EXTREMITIES:  No edema; No deformity   EKG today demonstrates  Vent. rate 105 BPM PR interval * ms QRS duration 86 ms QT/QTcB 346/457 ms P-R-T axes * 39 18 Atrial fibrillation with rapid ventricular response Abnormal ECG When compared with ECG of 13-Jan-2024 17:41, PREVIOUS ECG IS PRESENT  Echo N/A  ASSESSMENT & PLAN CHA2DS2-VASc Score = 0  The patient's score is based upon: CHF History: 0 HTN History: 0 Diabetes History: 0 Stroke History: 0 Vascular Disease History: 0 Age Score: 0 Gender Score: 0       ASSESSMENT AND PLAN: Persistent Atrial Fibrillation (ICD10:  I48.19) The patient's CHA2DS2-VASc score is 0, indicating a 0.2% annual risk of stroke.    He is currently in Afib. Continue diltiazem 120 mg BID. Continue Eliquis 5 mg BID.  Labs drawn in ED on 2/23. Education provided about Afib. Discussion about medication treatments and ablation going forward if indicated. We talked about cardioversion as procedure to help try to convert to NSR. After discussion of risks vs benefits, patient agrees would like to proceed with cardioversion. We will plan for patient to stop Eliquis 4 weeks from date of cardioversion. Will refer for sleep study. Will order baseline echocardiogram once patient is back  in NSR. He has an upcoming visit with Dr. Royann Shivers to establish care. Continue monitoring rhythm with Kardiamobile device.   Informed Consent   Shared Decision Making/Informed Consent The risks (stroke, cardiac arrhythmias rarely resulting in the need for a temporary or permanent pacemaker, skin irritation or burns and complications associated with conscious sedation including aspiration, arrhythmia, respiratory failure and death), benefits (restoration of normal sinus rhythm) and alternatives of a direct current cardioversion were explained in detail to Mr. Calandra and he agrees to proceed.         Follow up 2 weeks after DCCV.   Lake Bells, PA-C  Afib Clinic [facility] [address] [place], [place] [address] [phone]

## 2024-01-23 NOTE — Progress Notes (Signed)
 Follow-Up Visit   Subjective  John Hunt is a 63 y.o. male who presents for the following: Skin Cancer Screening and Full Body Skin Exam Sebopsoriasis face, scalp Zoryve ~3x.wk, Inverse psoriasis gluteal crease Zoryve 3x/wk The patient presents for Total-Body Skin Exam (TBSE) for skin cancer screening and mole check. The patient has spots, moles and lesions to be evaluated, some may be new or changing and the patient may have concern these could be cancer.  The following portions of the chart were reviewed this encounter and updated as appropriate: medications, allergies, medical history  Review of Systems:  No other skin or systemic complaints except as noted in HPI or Assessment and Plan.  Objective  Well appearing patient in no apparent distress; mood and affect are within normal limits.  A full examination was performed including scalp, head, eyes, ears, nose, lips, neck, chest, axillae, abdomen, back, buttocks, bilateral upper extremities, bilateral lower extremities, hands, feet, fingers, toes, fingernails, and toenails. All findings within normal limits unless otherwise noted below.   Relevant physical exam findings are noted in the Assessment and Plan.  R cheek x 1, R forearm x 1, L calf x 1 (3) Stuck on waxy paps with erythema  Assessment & Plan   SKIN CANCER SCREENING PERFORMED TODAY.  ACTINIC DAMAGE - Chronic condition, secondary to cumulative UV/sun exposure - diffuse scaly erythematous macules with underlying dyspigmentation - Recommend daily broad spectrum sunscreen SPF 30+ to sun-exposed areas, reapply every 2 hours as needed.  - Staying in the shade or wearing long sleeves, sun glasses (UVA+UVB protection) and wide brim hats (4-inch brim around the entire circumference of the hat) are also recommended for sun protection.  - Call for new or changing lesions.  LENTIGINES, SEBORRHEIC KERATOSES, HEMANGIOMAS - Benign normal skin lesions - Benign-appearing - Call  for any changes  MELANOCYTIC NEVI - Tan-brown and/or pink-flesh-colored symmetric macules and papules - Benign appearing on exam today - Observation - Call clinic for new or changing moles - Recommend daily use of broad spectrum spf 30+ sunscreen to sun-exposed areas.   SEBOPSORIASIS  scalp, face Exam: face, scalp clear today Chronic condition with duration or expected duration over one year. Currently well-controlled. Counseling on psoriasis and coordination of care  psoriasis is a chronic non-curable, but treatable genetic/hereditary disease that may have other systemic features affecting other organ systems such as joints (Psoriatic Arthritis). It is associated with an increased risk of inflammatory bowel disease, heart disease, non-alcoholic fatty liver disease, and depression.  Treatments include light and laser treatments; topical medications; and systemic medications including oral and injectables. Treatment Plan: Cont Zoryve cream ~3d/wk but up to 7 days per week as needed for prosiasis scalp and perianal areas  Long term medication management.  Patient is using long term (months to years) prescription medication  to control their dermatologic condition.  These medications require periodic monitoring to evaluate for efficacy and side effects and may require periodic laboratory monitoring.  INVERSE PSORIASIS Gluteal crease Exam: gluteal crease clear today Chronic condition with duration or expected duration over one year. Currently well-controlled. Counseling on psoriasis and coordination of care  psoriasis is a chronic non-curable, but treatable genetic/hereditary disease that may have other systemic features affecting other organ systems such as joints (Psoriatic Arthritis). It is associated with an increased risk of inflammatory bowel disease, heart disease, non-alcoholic fatty liver disease, and depression.  Treatments include light and laser treatments; topical medications; and  systemic medications including oral and injectables.  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 Condition is symptomatic / bothersome to patient. Not to goal. Intertrigo is a chronic recurrent rash that occurs in skin fold areas that may be associated with friction; heat; moisture; yeast; fungus; and bacteria.  It is exacerbated by increased movement / activity; sweating; and higher atmospheric temperature.  Use of an absorbant powder such as Zeasorb AF powder or other OTC antifungal powder to the area daily can prevent rash recurrence. Other options to help keep the area dry include blow drying the area after bathing or using antiperspirant products such as Duradry sweat minimizing gel.  Treatment Plan: Discussed topical treatment, pt declines  INFLAMED SEBORRHEIC KERATOSIS (3) R cheek x 1, R forearm x 1, L calf x 1 (3) Symptomatic, irritating, patient would like treated. Destruction of lesion - R cheek x 1, R forearm x 1, L calf x 1 (3) Complexity: simple   Destruction method: cryotherapy   Informed consent: discussed and consent obtained   Timeout:  patient name, date of birth, surgical site, and procedure verified Lesion destroyed using liquid nitrogen: Yes   Region frozen until ice ball extended beyond lesion: Yes   Outcome: patient tolerated procedure well with no complications   Post-procedure details: wound care instructions given   INVERSE PSORIASIS   Related Medications Roflumilast (ZORYVE) 0.3 % CREA Apply 1 application  topically daily. qd to aa psoriasis face, scalp, body Return in about 1 year (around 01/22/2025) for TBSE.  I, Ardis Rowan, RMA, am acting as scribe for Armida Sans, MD .   Documentation: I have reviewed the above documentation for accuracy and completeness, and I agree with the  above.  Armida Sans, MD

## 2024-01-23 NOTE — Patient Instructions (Signed)
 Cardioversion scheduled for: Wednesday, March 19th   - Arrive at the [facility] and go to admitting at 9:00AM   - Do not eat or drink anything after midnight the night prior to your procedure.   - Take all your morning medication (except diabetic medications) with a sip of water prior to arrival.  - You will not be able to drive home after your procedure.    - Do NOT miss any doses of your blood thinner - if you should miss a dose please notify our office immediately.   - If you feel as if you go back into normal rhythm prior to scheduled cardioversion, please notify our office immediately.   If your procedure is canceled in the cardioversion suite you will be charged a cancellation fee.    Sleep center will call to schedule appointment

## 2024-01-23 NOTE — Patient Instructions (Addendum)
 Cryotherapy Aftercare  Wash gently with soap and water everyday.   Apply Vaseline and Band-Aid daily until healed.     Your prescription was sent to [facility] in [place]. A representative from [employer] will contact you within 2 business hours to verify your address and insurance information to schedule a free delivery. If for any reason you do not receive a phone call from them, please reach out to them. Their phone number is [phone] and their hours are Monday-Friday 9:00 am-5:00 pm.     Due to recent changes in healthcare laws, you may see results of your pathology and/or laboratory studies on MyChart before the doctors have had a chance to review them. We understand that in some cases there may be results that are confusing or concerning to you. Please understand that not all results are received at the same time and often the doctors may need to interpret multiple results in order to provide you with the best plan of care or course of treatment. Therefore, we ask that you please give [place] 2 business days to thoroughly review all your results before contacting the office for clarification. Should we see a critical lab result, you will be contacted sooner.   If You Need Anything After Your Visit  If you have any questions or concerns for your doctor, please call our main line at [phone] and press option 4 to reach your doctor's medical assistant. If no one answers, please leave a voicemail as directed and we will return your call as soon as possible. Messages left after 4 pm will be answered the following business day.   You may also send [place] a message via MyChart. We typically respond to MyChart messages within 1-2 business days.  For prescription refills, please ask your pharmacy to contact our office. Our fax number is [phone].  If you have an urgent issue when the clinic is closed that cannot wait until the next business day, you can page your doctor at the number  below.    Please note that while we do our best to be available for urgent issues outside of office hours, we are not available 24/7.   If you have an urgent issue and are unable to reach [place], you may choose to seek medical care at your doctor's office, retail clinic, urgent care center, or emergency room.  If you have a medical emergency, please immediately call 911 or go to the emergency department.  Pager Numbers  - Dr. Gwen Pounds: [phone]  - Dr. Roseanne Reno: [phone]  - Dr. Katrinka Blazing: [phone]   In the event of inclement weather, please call our main line at [phone] for an update on the status of any delays or closures.  Dermatology Medication Tips: Please keep the boxes that topical medications come in in order to help keep track of the instructions about where and how to use these. Pharmacies typically print the medication instructions only on the boxes and not directly on the medication tubes.   If your medication is too expensive, please contact our office at [phone] option 4 or send [place] a message through MyChart.   We are unable to tell what your co-pay for medications will be in advance as this is different depending on your insurance coverage. However, we may be able to find a substitute medication at lower cost or fill out paperwork to get insurance to cover a needed medication.   If a prior authorization is required to get your medication covered by your  insurance company, please allow [place] 1-2 business days to complete this process.  Drug prices often vary depending on where the prescription is filled and some pharmacies may offer cheaper prices.  The website www.goodrx.com contains coupons for medications through different pharmacies. The prices here do not account for what the cost may be with help from insurance (it may be cheaper with your insurance), but the website can give you the price if you did not use any insurance.  - You can print the associated  coupon and take it with your prescription to the pharmacy.  - You may also stop by our office during regular business hours and pick up a GoodRx coupon card.  - If you need your prescription sent electronically to a different pharmacy, notify our office through [facility] or by phone at [phone] option 4.     Si Usted Necesita Algo Despus de Su Visita  Tambin puede enviarnos un mensaje a travs de Clinical cytogeneticist. Por lo general respondemos a los mensajes de MyChart en el transcurso de 1 a 2 das hbiles.  Para renovar recetas, por favor pida a su farmacia que se ponga en contacto con nuestra oficina. Annie Sable de fax es [employer] [phone].  Si tiene un asunto urgente cuando la clnica est cerrada y que no puede esperar hasta el siguiente da hbil, puede llamar/localizar a su doctor(a) al nmero que aparece a continuacin.   Por favor, tenga en cuenta que aunque hacemos todo lo posible para estar disponibles para asuntos urgentes fuera del horario de [place], no estamos disponibles las 24 horas del da, los 7 [address]  [address] de la [place].   Si tiene un problema urgente y no puede comunicarse con nosotros, puede optar por buscar atencin mdica  en el consultorio de su doctor(a), en una clnica privada, en un centro de atencin urgente o en una sala de emergencias.  Si tiene Engineer, drilling, por favor llame inmediatamente al 911 o vaya a la sala de emergencias.  Nmeros de bper  - Dr. Gwen Pounds: [phone]  - Dra. Roseanne Reno: [phone]  - Dr. Katrinka Blazing: [phone]   En caso de inclemencias del tiempo, por favor llame a Lacy Duverney principal al [phone] para una actualizacin sobre el [place] de cualquier retraso o cierre.  Consejos para la medicacin en dermatologa: Por favor, guarde las cajas en las que vienen los medicamentos de uso tpico para ayudarle a seguir las instrucciones sobre dnde y cmo usarlos. Las farmacias generalmente imprimen las instrucciones del  medicamento slo en las cajas y no directamente en los tubos del [place].   Si su medicamento es muy caro, por favor, pngase en contacto con Rolm Gala llamando al [phone] y presione la opcin 4 o envenos un mensaje a travs de Clinical cytogeneticist.   No podemos decirle cul ser su copago por los medicamentos por adelantado ya que esto es diferente dependiendo de la cobertura de su seguro. Sin embargo, es posible que podamos encontrar un medicamento sustituto a Audiological scientist un formulario para que el seguro cubra el medicamento que se considera necesario.   Si se requiere una autorizacin previa para que su compaa de seguros [place] su medicamento, por favor permtanos de 1 a 2 das hbiles para completar [address]  Los precios de los medicamentos varan con frecuencia dependiendo del Environmental consultant de dnde se surte la receta y alguna farmacias pueden ofrecer precios ms baratos.  El sitio web www.goodrx.com tiene cupones para medicamentos de Health and safety inspector. Los precios aqu no tienen  en cuenta lo que podra costar con la ayuda del seguro (puede ser ms barato con su seguro), pero el sitio web puede darle el precio si no Visual merchandiser.  - Puede imprimir el cupn correspondiente y llevarlo con su receta a la farmacia.  - Tambin puede pasar por nuestra oficina durante el horario de atencin regular y Education officer, museum una tarjeta de cupones de GoodRx.  - Si necesita que su receta se enve electrnicamente a una farmacia diferente, informe a nuestra oficina a travs de MyChart de [place] o por telfono llamando al [phone] y presione la opcin 4.

## 2024-01-24 ENCOUNTER — Other Ambulatory Visit (HOSPITAL_COMMUNITY): Payer: Self-pay | Admitting: *Deleted

## 2024-01-24 MED ORDER — APIXABAN 5 MG PO TABS: 5.0000 mg | ORAL_TABLET | Freq: Two times a day (BID) | ORAL | 6 refills | Status: DC

## 2024-01-24 MED ORDER — DILTIAZEM HCL ER COATED BEADS 120 MG PO CP24: 120.0000 mg | ORAL_CAPSULE | Freq: Every day | ORAL | 6 refills | Status: DC

## 2024-02-01 NOTE — Progress Notes (Signed)
    Subjective:    Patient ID: John Hunt , male    DOB: 1961/08/29, 63 y.o.    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 He is physically active. Does not smoke.   PMH: No known drug allergies.  No chronic medications prior to ED visit.  History of surgery for right trigeminal neuralgia at [facility] in 2005.  History of prostatitis 1993.  Injured elbow in 1991.  Had colonoscopy by Dr. 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  Another polyp was a benign tubular adenoma.  It was felt that removing these 2 lesions were curative and no further intervention was necessary at the time.  Had benign hemangioma removed from left upper back in 2023.  Social history: Married.  Wife is also a International aid/development worker who works with him in Counselling psychologist in [place].  2 adult children, a son and a daughter.  Non-smoker.  Does not consume alcohol.  Patient has DVM degree from [employer].  Family history: Father died of an MI at age 64.  Mother with history of kidney and heart problems.  Brother with history of kidney stones and GI disorder.  Sister with history of kidney stones and GI disorder.  1 brother died of laryngeal cancer.  Brother who was also event variant deceased with a neurological disorder.                 Objective:    Patient has irregular irregular pulse c/w Atrial fibrillation.  He is chest is clear.  Cardiac exam: Irregular irregular rhythm rate controlled  20-minute conversation with patient regarding concerns and experience with recent sudden onset of atrial fibrillation without any known provocation.  No recent significant situational stress.  Works hard every day.  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      Assessment & Plan:   Patient has been referred to atrial fib clinic  with [facility] and has appt April 2nd but would also like to see Cardiologist for consultation.  He has an appointment with Dr. Royann Shivers Mar 31, 2024.  Continue with Eliquis 5 mg twice daily.  Given small quality of Xanax on March 5 to take up to 3 times daily if needed for anxiety.  Currently on Cardizem CD 120 mg daily for rate control.     Margaree Mackintosh, MD

## 2024-02-01 NOTE — Patient Instructions (Addendum)
 Patient has an appointment with A-fib clinic April 2.  He is on anticoagulation therapy and Cardizem.  He would like to see a Cardiologist and we have made him appointment with Dr. Royann Shivers from 07/01/2024.  He has an appointment here for health maintenance exam in mid June

## 2024-02-05 NOTE — Progress Notes (Signed)
 Pt called for pre procedure instructions. Arrival time 0845 NPO after midnight explained Instructed to take am meds with sip of water and confirmed blood thinner consistency Instructed pt need for ride home tomorrow and have responsible adult with them for 24 hrs post procedure.

## 2024-02-06 ENCOUNTER — Ambulatory Visit (HOSPITAL_COMMUNITY): Admitting: Certified Registered Nurse Anesthetist

## 2024-02-06 ENCOUNTER — Other Ambulatory Visit: Payer: Self-pay

## 2024-02-06 ENCOUNTER — Encounter (HOSPITAL_COMMUNITY)
Admission: RE | Disposition: A | Discharge status: Home / Self Care | Payer: Self-pay | Source: Ambulatory Visit | Attending: Cardiology

## 2024-02-06 ENCOUNTER — Ambulatory Visit (HOSPITAL_COMMUNITY)
Admission: RE | Admit: 2024-02-06 | Discharge: 2024-02-06 | Disposition: A | Discharge status: Home / Self Care | Source: Ambulatory Visit | Attending: Cardiology | Admitting: Cardiology

## 2024-02-06 ENCOUNTER — Encounter (HOSPITAL_COMMUNITY): Payer: Self-pay | Admitting: Cardiology

## 2024-02-06 DIAGNOSIS — Z79899 Other long term (current) drug therapy: Secondary | ICD-10-CM | POA: Diagnosis not present

## 2024-02-06 DIAGNOSIS — I4819 Other persistent atrial fibrillation: Secondary | ICD-10-CM | POA: Insufficient documentation

## 2024-02-06 DIAGNOSIS — I4891 Unspecified atrial fibrillation: Secondary | ICD-10-CM

## 2024-02-06 DIAGNOSIS — Z7901 Long term (current) use of anticoagulants: Secondary | ICD-10-CM | POA: Diagnosis not present

## 2024-02-06 HISTORY — PX: CARDIOVERSION: EP1203

## 2024-02-06 SURGERY — CARDIOVERSION (CATH LAB)
Anesthesia: General

## 2024-02-06 MED ORDER — LIDOCAINE 2% (20 MG/ML) 5 ML SYRINGE
INTRAMUSCULAR | Status: DC | PRN
  Administered 2024-02-06: 20 mg via INTRAVENOUS

## 2024-02-06 MED ORDER — SODIUM CHLORIDE 0.9% FLUSH: 3.0000 mL | INTRAVENOUS | Status: DC | PRN

## 2024-02-06 MED ORDER — PROPOFOL 10 MG/ML IV BOLUS
INTRAVENOUS | Status: DC | PRN
  Administered 2024-02-06: 30 mg via INTRAVENOUS
  Administered 2024-02-06: 50 mg via INTRAVENOUS

## 2024-02-06 MED ORDER — SODIUM CHLORIDE 0.9% FLUSH: 3.0000 mL | Freq: Two times a day (BID) | INTRAVENOUS | Status: DC

## 2024-02-06 SURGICAL SUPPLY — 1 items: PAD DEFIB RADIO PHYSIO CONN (PAD) ×2 IMPLANT

## 2024-02-06 NOTE — Interval H&P Note (Signed)
 History and Physical Interval Note:  02/06/2024 8:51 AM  John Hunt  has presented today for surgery, with the diagnosis of afib.  The various methods of treatment have been discussed with the patient and family. After consideration of risks, benefits and other options for treatment, the patient has consented to  Procedure(s): CARDIOVERSION (N/A) as a surgical intervention.  The patient's history has been reviewed, patient examined, no change in status, stable for surgery.  I have reviewed the patient's chart and labs.  Questions were answered to the patient's satisfaction.     Armanda Magic

## 2024-02-06 NOTE — Transfer of Care (Signed)
 Immediate Anesthesia Transfer of Care Note  Patient: John Hunt  Procedure(s) Performed: CARDIOVERSION  Patient Location: PACU and Cath Lab  Anesthesia Type:General  Level of Consciousness: drowsy  Airway & Oxygen Therapy: Patient Spontanous Breathing and Patient connected to nasal cannula oxygen  Post-op Assessment: Report given to RN and Post -op Vital signs reviewed and stable  Post vital signs: Reviewed and stable  Last Vitals:  Vitals Value Taken Time  BP    Temp    Pulse 88 02/06/24 0938  Resp 19 02/06/24 0938  SpO2 95 % 02/06/24 0938  Vitals shown include unfiled device data.  Last Pain:  Vitals:   02/06/24 0907  TempSrc:   PainSc: 0-No pain         Complications: No notable events documented.

## 2024-02-06 NOTE — CV Procedure (Signed)
    Electrical Cardioversion Procedure Note John Hunt [id number] Nov 01, 1961  Procedure: Electrical Cardioversion Indications:  Atrial Fibrillation  Time Out: Verified patient identification, verified procedure,medications/allergies/relevent history reviewed, required imaging and test results available.  Performed  Procedure Details  During this procedure the patient is administered a total of Propofol 100 mg and Lidocaine 20 mg to achieve and maintain moderate conscious sedation.  The patient's heart rate, blood pressure, and oxygen saturation are monitored continuously during the procedure. The period of conscious sedation is 2 minutes, of which I was present face-to-face 100% of this time. John Heath, CRNA is an independent, trained observer who assisted in the monitoring of the patient's level of consciousness.     Cardioversion was done with synchronized biphasic defibrillation with AP pads with 200watts.  The patient converted to normal sinus rhythm. The patient tolerated the procedure well   IMPRESSION:  Successful cardioversion of atrial fibrillation    John Hunt 02/06/2024, 9:03 AM

## 2024-02-08 NOTE — Anesthesia Preprocedure Evaluation (Signed)
 Anesthesia Evaluation  Patient identified by MRN, date of birth, ID band Patient awake    Reviewed: Allergy & Precautions, H&P , NPO status , Patient's Chart, lab work & pertinent test results  Airway Mallampati: II  TM Distance: >3 FB Neck ROM: Full    Dental  (+) Dental Advisory Given   Pulmonary neg pulmonary ROS   breath sounds clear to auscultation       Cardiovascular + dysrhythmias Atrial Fibrillation  Rhythm:Irregular     Neuro/Psych negative neurological ROS  negative psych ROS   GI/Hepatic negative GI ROS, Neg liver ROS,,,  Endo/Other  negative endocrine ROS    Renal/GU Renal disease     Musculoskeletal negative musculoskeletal ROS (+)    Abdominal   Peds  Hematology  (+) Blood dyscrasia   Anesthesia Other Findings   Reproductive/Obstetrics                              Anesthesia Physical Anesthesia Plan  ASA: 2  Anesthesia Plan: General   Post-op Pain Management:    Induction: Intravenous  PONV Risk Score and Plan: 2 and Treatment may vary due to age or medical condition  Airway Management Planned: Natural Airway, Simple Face Mask and Nasal Cannula  Additional Equipment: None  Intra-op Plan:   Post-operative Plan:   Informed Consent: I have reviewed the patients History and Physical, chart, labs and discussed the procedure including the risks, benefits and alternatives for the proposed anesthesia with the patient or authorized representative who has indicated his/her understanding and acceptance.     Dental advisory given  Plan Discussed with: CRNA  Anesthesia Plan Comments:          Anesthesia Quick Evaluation

## 2024-02-08 NOTE — Anesthesia Postprocedure Evaluation (Signed)
 Anesthesia Post Note  Patient: John Hunt  Procedure(s) Performed: CARDIOVERSION     Patient location during evaluation: Cath Lab Anesthesia Type: General Level of consciousness: awake and alert Pain management: pain level controlled Vital Signs Assessment: post-procedure vital signs reviewed and stable Respiratory status: spontaneous breathing, nonlabored ventilation and respiratory function stable Cardiovascular status: blood pressure returned to baseline and stable Postop Assessment: no apparent nausea or vomiting Anesthetic complications: no   No notable events documented.  Last Vitals:  Vitals:   02/06/24 1000 02/06/24 1010  BP: 114/83 115/84  Pulse: 68 63  Resp: 15 13  Temp:    SpO2: 93% 95%    Last Pain:  Vitals:   02/06/24 0951  TempSrc: Temporal  PainSc: 0-No pain                 Maisie Hauser

## 2024-02-20 ENCOUNTER — Ambulatory Visit (HOSPITAL_COMMUNITY)
Admission: RE | Admit: 2024-02-20 | Discharge: 2024-02-20 | Disposition: A | Discharge status: Home / Self Care | Source: Ambulatory Visit | Attending: Internal Medicine | Admitting: Internal Medicine

## 2024-02-20 VITALS — BP 128/76 | HR 60 | Ht 73.0 in | Wt 200.8 lb

## 2024-02-20 DIAGNOSIS — Z79899 Other long term (current) drug therapy: Secondary | ICD-10-CM | POA: Diagnosis not present

## 2024-02-20 DIAGNOSIS — Z7901 Long term (current) use of anticoagulants: Secondary | ICD-10-CM | POA: Insufficient documentation

## 2024-02-20 DIAGNOSIS — I4819 Other persistent atrial fibrillation: Secondary | ICD-10-CM | POA: Insufficient documentation

## 2024-02-20 NOTE — Progress Notes (Signed)
 Primary Care Physician: Margaree Mackintosh, MD Primary Cardiologist: None Electrophysiologist: None     Referring Physician: Dr. Laneta Simmers is a 63 y.o. male with a history of prior kidney stones, adenocarcinoma of the rectosigmoid junction in 2014, and atrial fibrillation who presents for consultation in the [facility]. ED visit on 01/13/24 for tachycardia found to be in new onset Afib with RVR. Discharged on diltiazem 60 mg BID in rate controlled Afib. Dr. Odis Hollingshead noted via telephone encounter to start patient on [facility] due to unknown length of time being in Afib. Patient is on Eliquis 5 mg BID for a CHADS2VASC score of zero.  On follow up 02/20/24, he is currently in NSR. S/p successful DCCV on 02/06/24. He is taking diltiazem 120 mg once daily. He feels tired since cardioversion. He has not missed any doses of Eliquis 5 mg BID.   Today, he denies symptoms of palpitations, chest pain, shortness of breath, orthopnea, PND, lower extremity edema, dizziness, presyncope, syncope, snoring, daytime somnolence, bleeding, or neurologic sequela. The patient is tolerating medications without difficulties and is otherwise without complaint today.    Atrial Fibrillation Risk Factors:  he does have symptoms or diagnosis of sleep apnea.   he has a BMI of Body mass index is 26.49 kg/m.Marland Kitchen Filed Weights   02/20/24 1042  Weight: 91.1 kg     Current Outpatient Medications  Medication Sig Dispense Refill   ALPRAZolam (XANAX) 0.5 MG tablet Take 1 tablet (0.5 mg total) by mouth 3 (three) times daily as needed for anxiety. 15 tablet 0   apixaban (ELIQUIS) 5 MG TABS tablet Take 1 tablet (5 mg total) by mouth 2 (two) times daily. 60 tablet 6   clomiPHENE (CLOMID) 50 MG tablet Take 25 mg by mouth at bedtime.     diltiazem (CARDIZEM CD) 120 MG 24 hr capsule Take 1 capsule (120 mg total) by mouth daily. 30 capsule 6   Multiple Vitamin (MULTIVITAMIN) capsule Take 1 capsule by  mouth 2 (two) times a week.     Roflumilast (ZORYVE) 0.3 % CREA Apply 1 application  topically daily. qd to aa psoriasis face, scalp, body (Patient taking differently: Apply 1 application  topically 3 (three) times a week. qd to aa psoriasis face, scalp, body) 60 g 11   No current facility-administered medications for this encounter.    Atrial Fibrillation Management history:  Previous antiarrhythmic drugs: none Previous cardioversions: 02/06/24 Previous ablations: none Anticoagulation history: Eliquis 5 mg BID   ROS- All systems are reviewed and negative except as per the HPI above.  Physical Exam: BP 128/76   Pulse 60   Ht 6\' 1"  (1.854 m)   Wt 91.1 kg   BMI 26.49 kg/m   GEN- The patient is well appearing, alert and oriented x 3 today.   Neck - no JVD or carotid bruit noted Lungs- Clear to ausculation bilaterally, normal work of breathing Heart- Regular rate and rhythm, no murmurs, rubs or gallops, PMI not laterally displaced Extremities- no clubbing, cyanosis, or edema Skin - no rash or ecchymosis noted   EKG today demonstrates  Vent. rate 60 BPM PR interval 162 ms QRS duration 88 ms QT/QTcB 410/410 ms P-R-T axes 42 31 45 Normal sinus rhythm Normal ECG When compared with ECG of 06-Feb-2024 09:52, PREVIOUS ECG IS PRESENT  Echo N/A  ASSESSMENT & PLAN CHA2DS2-VASc Score = 0  The patient's score is based upon: CHF History: 0 HTN History: 0  Diabetes History: 0 Stroke History: 0 Vascular Disease History: 0 Age Score: 0 Gender Score: 0       ASSESSMENT AND PLAN: Persistent Atrial Fibrillation (ICD10:  I48.19) The patient's CHA2DS2-VASc score is 0, indicating a 0.2% annual risk of stroke.   S/p successful DCCV on 02/06/24.  He is currently in NSR. He will continue diltiazem 120 mg daily until he finishes prescription and then discontinue. He will continue Eliquis 5 mg BID and stop medication 4 weeks from date of cardioversion. He will be establishing care with  Dr. Royann Shivers in May.   We briefly discussed ablation going forward if he has ERAF.    Follow up Afib clinic prn.   Lake Bells, PA-C  Afib Clinic [facility] [address] [place], [place] [address] [phone]

## 2024-02-21 ENCOUNTER — Other Ambulatory Visit (HOSPITAL_COMMUNITY): Payer: Self-pay

## 2024-02-21 ENCOUNTER — Telehealth (HOSPITAL_COMMUNITY): Payer: Self-pay | Admitting: *Deleted

## 2024-02-21 DIAGNOSIS — I4891 Unspecified atrial fibrillation: Secondary | ICD-10-CM

## 2024-02-21 NOTE — Telephone Encounter (Signed)
 Echocardiogram authorized [id number] valid 4/3-5/18

## 2024-03-19 ENCOUNTER — Ambulatory Visit (HOSPITAL_COMMUNITY)
Admission: RE | Admit: 2024-03-19 | Discharge: 2024-03-19 | Disposition: A | Discharge status: Home / Self Care | Source: Ambulatory Visit | Attending: Internal Medicine | Admitting: Internal Medicine

## 2024-03-19 DIAGNOSIS — I4891 Unspecified atrial fibrillation: Secondary | ICD-10-CM | POA: Diagnosis present

## 2024-03-19 LAB — ECHOCARDIOGRAM COMPLETE
Area-P 1/2: 3.34 cm2
S' Lateral: 3.2 cm

## 2024-03-20 ENCOUNTER — Encounter (HOSPITAL_COMMUNITY): Payer: Self-pay

## 2024-03-30 NOTE — Progress Notes (Unsigned)
 Cardiology Office Note:  .   Date:  04/02/2024  ID:  John Hunt, DOB 02-17-1961, MRN [id number] PCP: John Evener, MD  [facility] Providers Cardiologist:  None    History of Present Illness: .   John Hunt is a 63 y.o. male with recently diagnosed persistent atrial fibrillation, no significant structural heart disease, s/p cardioversion 02/06/2024. He has a history of colon cancer (sigmoid colectomy 2014) and nephrolithiasis. Recent sleep study shows mild OSA.  Not have hypertension, diabetes mellitus, known CAD or PAD, history of stroke or TIA or congestive heart failure.  Atrial fibrillation was asymptomatic and diagnosed incidentally in late February when he went to donate blood.  He was completely unaware of palpitations despite the fact that he was tachycardic with a rate of 147 bpm.  His previous healthcare contact in December reportedly showed normal rhythm.  He has 2 brothers that also have atrial fibrillation.  The arrhythmia was diagnosed roughly a week after immunization for influenza and COVID, but there is no clear reason to suspect a causal phenomenon.  TSH is normal.  Subsequently, he did undergo a sleep study that showed mild obstructive sleep apnea and is currently struggling to adjust to use of CPAP.  He started anticoagulation and then underwent elective cardioversion 02/06/2024.  He has maintained normal rhythm since then. An echocardiogram performed 03/19/2024 shows normal right and left ventricular size and systolic function and the atrial also described as being normal in size.  No valvular abnormalities were seen.  He remains asymptomatic from a cardiovascular point of view.  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  He has not experienced palpitations or syncope.  He has never had a stroke or TIA or focal neurological events.    Studies Reviewed: Aaron Aas       Personally reviewed ECG tracing from 02/20/2024 which shows normal sinus rhythm and there is a completely normal tracing  EKG Interpretation Date/Time:    Ventricular Rate:    PR Interval:    QRS Duration:    QT Interval:    QTC Calculation:   R Axis:      Text Interpretation:         ECHO 03/19/2024   1. Left ventricular ejection fraction, by estimation, is 60 to 65%. The  left ventricle has normal function. Left ventricular endocardial border  not optimally defined to evaluate regional wall motion. Left ventricular  diastolic parameters were normal.   2. Right ventricular systolic function is normal. The right ventricular  size is not well visualized.   3. The mitral valve is grossly normal. No evidence of mitral valve  regurgitation. No evidence of mitral stenosis.   4. The aortic valve is grossly normal. Aortic valve regurgitation is not  visualized. No aortic stenosis is present.   5. The inferior vena cava is normal in size with greater than 50%  respiratory variability, suggesting right atrial pressure of 3 mmHg.   Comparison(s): No prior Echocardiogram.      Risk Assessment/Calculations:    CHA2DS2-VASc Score = 0   This indicates a 0.2% annual risk of stroke. The patient's score is based upon: CHF History: 0 HTN History: 0 Diabetes History: 0 Stroke History: 0 Vascular Disease History: 0 Age Score: 0 Gender Score: 0          Physical Exam:   VS:  BP 100/72 (BP  Location: Left Arm, Patient Position: Sitting, Cuff Size: Normal)   Pulse 62   Ht 6\' 1"  (1.854 m)   Wt 89.4 kg   SpO2 95%   BMI 26.02 kg/m    Wt Readings from Last 3 Encounters:  03/31/24 89.4 kg  02/20/24 91.1 kg  02/06/24 90.7 kg    GEN: Well nourished, well developed in no acute distress NECK: No JVD; No carotid bruits CARDIAC: RRR, no murmurs, rubs, gallops RESPIRATORY:  Clear to auscultation without rales, wheezing or rhonchi  ABDOMEN: Soft, non-tender, non-distended EXTREMITIES:   No edema; No deformity   ASSESSMENT AND PLAN: .   AFib: Arrhythmia unaware.  Has maintained normal rhythm for roughly 2 months since his cardioversion.  No structural heart disease on his echocardiogram.  Appears to have "lone atrial fibrillation" but it is interesting to note that his 2 brothers also have atrial fibrillation, without having advanced age or major cardiovascular problems.  Very low embolic risk so lifelong anticoagulation is not indicated, though this should be reviewed again when he turns 66.  On the other hand, reviewed the increased risk of embolic events around the time of cardioversion so that if he has recurrent atrial fibrillation we will want to ensure either very urgent cardioversion or 3 weeks of preceding anticoagulation.  Therefore, recommended self monitoring for arrhythmia recurrence with some type of personal electronic device.  He is strongly considering getting an Scientist, physiological. OSA: Unclear whether this has any relationship with his arrhythmia.  He does not have much in the way of daytime hypersomnolence although he does snore.  We discussed that CPAP is the major modality of treatment for this disorder, but since he is very lean he may do well with alternative therapies such as a dental device or the new inspire implant.  Recommended discussing these options with our sleep specialist, Dr. Micael Hunt. Remote history of rectosigmoid colon cancer s/p surgical cure 2014.       Dispo: Stop Eliquis .  (Save the remaining tablets in case of recurrence of atrial fibrillation).  Follow-up in sleep clinic with Dr. Micael Hunt. Patient Instructions  Medication Instructions:  No changes *If you need a refill on your cardiac medications before your next appointment, please call your pharmacy*  Follow-Up: At [facility], you and your health needs are our priority.  As part of our continuing mission to provide you with exceptional heart care, our providers are all part of one team.   This team includes your primary Cardiologist (physician) and Advanced Practice Providers or APPs (Physician Assistants and Nurse Practitioners) who all work together to provide you with the care you need, when you need it.  Your next appointment:   1 year(s)  Provider:   Dr Alvis Ba  We recommend signing up for the patient portal called "MyChart".  Sign up information is provided on this After Visit Summary.  MyChart is used to connect with patients for Virtual Visits (Telemedicine).  Patients are able to view lab/test results, encounter notes, upcoming appointments, etc.  Non-urgent messages can be sent to your provider as well.   To learn more about what you can do with MyChart, go to [email or web address].        Signed, John Rumple, MD

## 2024-03-31 ENCOUNTER — Ambulatory Visit: Payer: 59 | Attending: Cardiovascular Disease | Admitting: Cardiovascular Disease

## 2024-03-31 ENCOUNTER — Encounter: Payer: Self-pay | Admitting: Cardiovascular Disease

## 2024-03-31 VITALS — BP 100/72 | HR 62 | Ht 73.0 in | Wt 197.2 lb

## 2024-03-31 DIAGNOSIS — E7800 Pure hypercholesterolemia, unspecified: Secondary | ICD-10-CM

## 2024-03-31 DIAGNOSIS — Z85038 Personal history of other malignant neoplasm of large intestine: Secondary | ICD-10-CM | POA: Diagnosis not present

## 2024-03-31 DIAGNOSIS — D6869 Other thrombophilia: Secondary | ICD-10-CM | POA: Diagnosis not present

## 2024-03-31 DIAGNOSIS — I4819 Other persistent atrial fibrillation: Secondary | ICD-10-CM | POA: Diagnosis not present

## 2024-03-31 NOTE — Patient Instructions (Signed)
 Medication Instructions:  No changes *If you need a refill on your cardiac medications before your next appointment, please call your pharmacy*  Follow-Up: At [facility], you and your health needs are our priority.  As part of our continuing mission to provide you with exceptional heart care, our providers are all part of one team.  This team includes your primary Cardiologist (physician) and Advanced Practice Providers or APPs (Physician Assistants and Nurse Practitioners) who all work together to provide you with the care you need, when you need it.  Your next appointment:   1 year(s)  Provider:   Dr Alvis Ba  We recommend signing up for the patient portal called "MyChart".  Sign up information is provided on this After Visit Summary.  MyChart is used to connect with patients for Virtual Visits (Telemedicine).  Patients are able to view lab/test results, encounter notes, upcoming appointments, etc.  Non-urgent messages can be sent to your provider as well.   To learn more about what you can do with MyChart, go to [email or web address].

## 2024-04-29 ENCOUNTER — Telehealth: Payer: Self-pay

## 2024-04-29 ENCOUNTER — Other Ambulatory Visit: Payer: 59

## 2024-04-29 DIAGNOSIS — Z125 Encounter for screening for malignant neoplasm of prostate: Secondary | ICD-10-CM

## 2024-04-29 DIAGNOSIS — I4891 Unspecified atrial fibrillation: Secondary | ICD-10-CM

## 2024-04-29 DIAGNOSIS — Z0000 Encounter for general adult medical examination without abnormal findings: Secondary | ICD-10-CM

## 2024-04-29 DIAGNOSIS — E7800 Pure hypercholesterolemia, unspecified: Secondary | ICD-10-CM

## 2024-04-29 NOTE — Telephone Encounter (Signed)
 Copied from CRM [id number]. Topic: Appointments - Appointment Cancel/Reschedule >> Apr 29, 2024  4:55 PM Juleen Oakland F wrote: Patient realized he missed appointment today for the lab and wanted to reschedule sometime this week but wanted earlier times. Since he can't make it for labs this week he will do labs at his appt for physical 05/06/24.

## 2024-05-06 ENCOUNTER — Encounter: Payer: 59 | Admitting: Internal Medicine

## 2024-06-06 ENCOUNTER — Encounter: Payer: Self-pay | Admitting: Advanced Practice Midwife

## 2025-01-28 ENCOUNTER — Ambulatory Visit: Admitting: Dermatology
